# Patient Record
Sex: Female | Born: 1935 | Race: White | Hispanic: No | Smoking: Never smoker
Health system: Southern US, Community
[De-identification: ages and names within clinical notes are randomized; demographics above are authoritative.]

## PROBLEM LIST (undated history)

## (undated) DIAGNOSIS — F32A Depression, unspecified: Secondary | ICD-10-CM

## (undated) DIAGNOSIS — K219 Gastro-esophageal reflux disease without esophagitis: Secondary | ICD-10-CM

## (undated) DIAGNOSIS — H9190 Unspecified hearing loss, unspecified ear: Secondary | ICD-10-CM

## (undated) DIAGNOSIS — Z8542 Personal history of malignant neoplasm of other parts of uterus: Secondary | ICD-10-CM

## (undated) DIAGNOSIS — I10 Essential (primary) hypertension: Secondary | ICD-10-CM

## (undated) DIAGNOSIS — R0600 Dyspnea, unspecified: Secondary | ICD-10-CM

## (undated) DIAGNOSIS — M1990 Unspecified osteoarthritis, unspecified site: Secondary | ICD-10-CM

## (undated) DIAGNOSIS — R319 Hematuria, unspecified: Secondary | ICD-10-CM

## (undated) DIAGNOSIS — E785 Hyperlipidemia, unspecified: Secondary | ICD-10-CM

## (undated) DIAGNOSIS — D494 Neoplasm of unspecified behavior of bladder: Secondary | ICD-10-CM

## (undated) DIAGNOSIS — I6529 Occlusion and stenosis of unspecified carotid artery: Secondary | ICD-10-CM

## (undated) DIAGNOSIS — F329 Major depressive disorder, single episode, unspecified: Secondary | ICD-10-CM

## (undated) DIAGNOSIS — R0609 Other forms of dyspnea: Secondary | ICD-10-CM

## (undated) DIAGNOSIS — R112 Nausea with vomiting, unspecified: Secondary | ICD-10-CM

## (undated) DIAGNOSIS — Z9889 Other specified postprocedural states: Secondary | ICD-10-CM

## (undated) DIAGNOSIS — F419 Anxiety disorder, unspecified: Secondary | ICD-10-CM

## (undated) HISTORY — DX: Anxiety disorder, unspecified: F41.9

## (undated) HISTORY — PX: TRANSTHORACIC ECHOCARDIOGRAM: SHX275

## (undated) HISTORY — PX: CAROTID ENDARTERECTOMY: SUR193

## (undated) HISTORY — DX: Hyperlipidemia, unspecified: E78.5

## (undated) HISTORY — DX: Unspecified osteoarthritis, unspecified site: M19.90

## (undated) HISTORY — PX: CATARACT EXTRACTION W/ INTRAOCULAR LENS  IMPLANT, BILATERAL: SHX1307

## (undated) HISTORY — DX: Major depressive disorder, single episode, unspecified: F32.9

## (undated) HISTORY — DX: Depression, unspecified: F32.A

---

## 1999-06-11 HISTORY — PX: VAGINAL HYSTERECTOMY: SUR661

## 2009-10-17 ENCOUNTER — Encounter: Admission: RE | Admit: 2009-10-17 | Discharge: 2009-10-17 | Payer: Self-pay | Admitting: *Deleted

## 2009-10-23 ENCOUNTER — Encounter: Admission: RE | Admit: 2009-10-23 | Discharge: 2009-10-23 | Payer: Self-pay | Admitting: *Deleted

## 2010-01-09 ENCOUNTER — Ambulatory Visit: Payer: Self-pay | Admitting: Vascular Surgery

## 2010-01-19 ENCOUNTER — Ambulatory Visit: Payer: Self-pay | Admitting: Vascular Surgery

## 2010-01-19 ENCOUNTER — Inpatient Hospital Stay (HOSPITAL_COMMUNITY): Admission: RE | Admit: 2010-01-19 | Discharge: 2010-01-20 | Payer: Self-pay | Admitting: Vascular Surgery

## 2010-01-19 ENCOUNTER — Encounter: Payer: Self-pay | Admitting: Vascular Surgery

## 2010-02-06 ENCOUNTER — Ambulatory Visit: Payer: Self-pay | Admitting: Vascular Surgery

## 2010-07-01 ENCOUNTER — Encounter: Payer: Self-pay | Admitting: Internal Medicine

## 2010-08-07 ENCOUNTER — Other Ambulatory Visit (INDEPENDENT_AMBULATORY_CARE_PROVIDER_SITE_OTHER): Payer: Medicare Other

## 2010-08-07 ENCOUNTER — Ambulatory Visit (INDEPENDENT_AMBULATORY_CARE_PROVIDER_SITE_OTHER): Payer: Medicare Other | Admitting: Vascular Surgery

## 2010-08-07 DIAGNOSIS — Z48812 Encounter for surgical aftercare following surgery on the circulatory system: Secondary | ICD-10-CM

## 2010-08-07 DIAGNOSIS — I6529 Occlusion and stenosis of unspecified carotid artery: Secondary | ICD-10-CM

## 2010-08-08 NOTE — Assessment & Plan Note (Signed)
OFFICE VISIT  Ben, Loni B DOB:  1917/09/06                                       08/07/2010 CHART#:[id number]  This patient is a 77 year old female who returns for a 77-month follow-up regarding left carotid endarterectomy which I performed on 01/19/2010 for severe but asymptomatic left internal carotid stenosis.  She denies any hemispheric or non hemispheric TIAs, amaurosis fugax, diplopia, blurred vision or syncope since her surgery.  She is swallowing well and has no hoarseness.  She is taking 181 mg aspirin tablet per day.  Stable chronic medical problems. 1. Hypertension. 2. Arthritis. 3. Bilateral cataracts. 4. History of endometrial cancer in 2001. 5. Previous hysterectomy. 6. Negative coronary artery disease, diabetes or stroke.  SOCIAL HISTORY:  The patient is widowed, has 1 child and does not use tobacco or alcohol.  REVIEW OF SYSTEMS:  Denies any chest pain.  Does occasionally have dyspnea on exertion, no claudication.  She complains of occasional reflux esophagitis and dysuria.  Also arthritis, joint pain, muscle pain and decreased hearing.  All other systems are negative on complete review of systems.  PHYSICAL EXAMINATION:  Blood pressure 136/74, heart rate 79, respirations 14.  General:  She is a well-developed, well-nourished female in no apparent distress, alert and oriented x3, appears much younger than her stated age of 60.  HEENT:  Exam normal for age.  EOMs intact.  Lungs:  Clear to auscultation.  No rhonchi or wheezing. Cardiovascular:  Shows regular rhythm, no murmurs.  Carotid pulses 3+, no bruits are audible.  Left neck incision is well-healed.  Abdomen: Soft, nontender with no masses.  Neurologic:  Normal.  Today I ordered a carotid duplex exam which I reviewed and interpreted.  The left carotid endarterectomy site is widely patent and the right internal carotid is also widely patent with some minimal thickening.  I  reassured her regarding these findings.  We will see her again in 6 months for follow-up carotid duplex exam on an annual basis unless she develops any symptoms in the interim.    Quita Skye Hart [place], M.D. Electronically Signed  JDL/MEDQ  D:  08/07/2010  T:  08/08/2010  Job:  [id number]

## 2010-08-14 NOTE — Procedures (Unsigned)
CAROTID DUPLEX EXAM  INDICATION:  Carotid artery disease.  HISTORY: Diabetes:  No. Cardiac:  No. Hypertension:  Yes. Smoking:  No. Previous Surgery:  Left carotid endarterectomy with Dacron patch 06/11/2009. CV History:  The patient is currently asymptomatic. Amaurosis Fugax No, Paresthesias No, Hemiparesis No                                      RIGHT             LEFT Brachial systolic pressure:         148               146 Brachial Doppler waveforms:         WNL               WNL Vertebral direction of flow:        Antegrade         Antegrade DUPLEX VELOCITIES (cm/sec) CCA peak systolic                   76                68 ECA peak systolic                   92                91 ICA peak systolic                   81                73 ICA end diastolic                   23                18 PLAQUE MORPHOLOGY:                  Heterogeneous PLAQUE AMOUNT:                      Mild to moderate PLAQUE LOCATION:                    CCA, ICA, ECA  IMPRESSION: 1. Patent left carotid endarterectomy. 2. Bilaterally no hemodynamically significant stenosis. 3. Minimal intimal thickening within the right common carotid.  ___________________________________________ Quita Skye. Hart [place], M.D.  OD/MEDQ  D:  08/07/2010  T:  08/07/2010  Job:  [id number]

## 2010-08-24 LAB — TYPE AND SCREEN

## 2010-08-24 LAB — BASIC METABOLIC PANEL
CO2: 21 mEq/L (ref 19–32)
Chloride: 104 mEq/L (ref 96–112)
Glucose, Bld: 170 mg/dL — ABNORMAL HIGH (ref 70–99)
Potassium: 4.2 mEq/L (ref 3.5–5.1)
Sodium: 136 mEq/L (ref 135–145)

## 2010-08-24 LAB — CBC
HCT: 36.5 % (ref 36.0–46.0)
HCT: 46.2 % — ABNORMAL HIGH (ref 36.0–46.0)
Hemoglobin: 12.1 g/dL (ref 12.0–15.0)
MCH: 29.4 pg (ref 26.0–34.0)
MCH: 29.8 pg (ref 26.0–34.0)
MCHC: 33.2 g/dL (ref 30.0–36.0)
MCV: 88.6 fL (ref 78.0–100.0)
MCV: 88.7 fL (ref 78.0–100.0)
Platelets: 272 10*3/uL (ref 150–400)

## 2010-08-24 LAB — COMPREHENSIVE METABOLIC PANEL
ALT: 18 U/L (ref 0–35)
AST: 26 U/L (ref 0–37)
CO2: 23 mEq/L (ref 19–32)
Calcium: 10 mg/dL (ref 8.4–10.5)
Chloride: 106 mEq/L (ref 96–112)
GFR calc Af Amer: 48 mL/min — ABNORMAL LOW (ref >60)
GFR calc non Af Amer: 40 mL/min — ABNORMAL LOW (ref >60)
Potassium: 4.3 mEq/L (ref 3.5–5.1)
Sodium: 141 mEq/L (ref 135–145)
Total Bilirubin: 0.6 mg/dL (ref 0.3–1.2)

## 2010-08-24 LAB — URINE MICROSCOPIC-ADD ON

## 2010-08-24 LAB — URINALYSIS, ROUTINE W REFLEX MICROSCOPIC
Glucose, UA: NEGATIVE mg/dL
Ketones, ur: 15 mg/dL — AB
pH: 5.5 (ref 5.0–8.0)

## 2010-08-24 LAB — SURGICAL PCR SCREEN: Staphylococcus aureus: NEGATIVE

## 2010-08-31 ENCOUNTER — Other Ambulatory Visit: Payer: Self-pay | Admitting: Dermatology

## 2010-10-23 NOTE — Consult Note (Signed)
NEW PATIENT CONSULTATION   Karen [name], Karen [name]  DOB:  Feb 26, 1918                                       01/09/2010  CHART#:[id number]   HISTORY OF PRESENT ILLNESS:  This is a vigorous 76 year old female  patient referred by Dr. Eldridge Dace for carotid occlusive disease.  This  patient has been experiencing dizziness, particularly at night,  unaccompanied by syncope, hemiparesis, aphasia, amaurosis fugax, or  other neurologic symptoms.  She has no history of TIAs or stroke.  The  dizziness usually resolves fairly quickly and happens more frequently in  bed than during the daytime.  If she changes position rapidly, she  becomes transiently dizzy.  She was found to have a carotid bruit by Dr.  Eldridge Dace who evaluated her cardiac status and found it to be quite  adequate.  Carotid duplex revealed an 80% left internal carotid stenosis  with mild right internal carotid stenosis.  I have reviewed the study  report from [facility] regarding that test.  She also had an  echocardiogram performed at [facility] which revealed an excellent  ejection fraction and no significant abnormalities.   CHRONIC MEDICAL PROBLEMS:  1. Hypertension.  2. Arthritis.  3. Cataracts, bilateral.  4. History of endometrial cancer in 2001 status post hysterectomy.  5. Negative for coronary artery disease, diabetes, or stroke.   SOCIAL HISTORY:  The patient is widowed, has 1 child, does not use  tobacco or alcohol.   FAMILY HISTORY:  Positive for coronary artery disease in her father.  Negative for diabetes and stroke.   REVIEW OF SYSTEMS:  She does have the dizziness as noted.  Has  arthritis, joint pain, leg discomfort at night, shortness of breath with  exertion.  No orthopnea, wheezing, chronic bronchitis, or hemoptysis.  She does have some depression and anxiety.  All other systems in review  of systems are negative.   PHYSICAL EXAM:  Blood pressure 160/76, heart rate 91, respirations  14.  General:  She is a well-developed, well-nourished, elderly female in no  apparent distress, who appears younger than her stated age of 64.  She  is alert and oriented x3.  HEENT:  Normal for age.  EOMs intact.  Lungs:  Clear to auscultation.  No rhonchi or wheezing.  Cardiovascular:  Regular rhythm.  No murmurs.  Carotid pulses are 3+ with soft bruit on  the left side.  Abdomen:  Soft, nontender with no masses.  She has 3+  femoral, popliteal, and 2+ dorsalis pedis pulses bilaterally.  Musculoskeletal:  Free of major deformities.  Neurologic:  Normal.  Skin:  Free of rashes.   Today, I ordered a carotid duplex exam of the left side to compare to  the previous study and our findings are quite similar with an 80% to 90%  left internal carotid stenosis.   IMPRESSION:  I think the patient does have a 80% to 90% left internal  carotid stenosis which is asymptomatic.  I discussed the risks and  benefits of surgery versus medical management with her and her daughter-  in-law.  She would like to proceed with left carotid surgery and we have  scheduled that for Friday, August 12th, at [facility].  The risks and  benefits been thoroughly discussed and she would like to proceed.     Quita Skye Hart [place], M.D.  Electronically Signed  JDL/MEDQ  D:  01/09/2010  T:  01/09/2010  Job:  [id number]   cc:   Corky Crafts, MD  Nancee Liter, MD

## 2010-10-23 NOTE — Procedures (Signed)
CAROTID DUPLEX EXAM   INDICATION:  Follow up known left carotid artery disease.   HISTORY:  Diabetes:  No.  Cardiac:  No.  Hypertension:  Yes.  Smoking:  No.  Previous Surgery:  No.  CV History:  No.  Amaurosis Fugax No, Paresthesias No, Hemiparesis No.                                       RIGHT             LEFT  Brachial systolic pressure:         154               150  Brachial Doppler waveforms:         WNL               WNL  Vertebral direction of flow:        Antegrade         Antegrade  DUPLEX VELOCITIES (cm/sec)  CCA peak systolic                                     88  ECA peak systolic                                     147  ICA peak systolic                                     401  ICA end diastolic                                     103  PLAQUE MORPHOLOGY:                                    Heterogenous  PLAQUE AMOUNT:                                        Moderate-to-severe  PLAQUE LOCATION:                                      ICA, ECA   IMPRESSION:  1. Limited study on the left side.  2. Left internal carotid artery suggests 60% to 79% (high end of      range).  3. Antegrade flow in bilateral vertebrals.   ___________________________________________  Quita Skye Hart [place], M.D.   CB/MEDQ  D:  01/09/2010  T:  01/09/2010  Job:  [id number]

## 2010-10-23 NOTE — Assessment & Plan Note (Signed)
OFFICE VISIT   [name], Karen B  DOB:  1918-05-14                                       02/06/2010  CHART#:[id number]   The patient returns for initial followup regarding her left carotid  endarterectomy done by me on August 12 for severe left internal carotid  stenosis which is asymptomatic.  She had mild right internal carotid  occlusive disease.  She has done very well since her procedure with no  neurologic symptoms suggestive of TIAs.  She is not taking aspirin at  the present time.  She is swallowing well and has no hoarseness.   PHYSICAL EXAMINATION:  Blood pressure 148/86, heart rate 99,  respirations 14.  Left neck incision is healed nicely.  She has 3+  carotid pulse with no audible bruit.  Her neurologic exam is normal.   I have suggested that she do take one 81 mg aspirin tablet a day if she  can tolerate this.  She will return to her normal activities and return  to see me in 6 months with a followup carotid duplex exam at that time  unless she develops any neurologic symptoms in the interim.     Quita Skye Hart [place], M.D.  Electronically Signed   JDL/MEDQ  D:  02/06/2010  T:  02/07/2010  Job:  [id number]   cc:   Corky Crafts, MD

## 2011-02-05 ENCOUNTER — Other Ambulatory Visit: Payer: Medicare Other

## 2011-02-26 ENCOUNTER — Other Ambulatory Visit (INDEPENDENT_AMBULATORY_CARE_PROVIDER_SITE_OTHER): Payer: Medicare Other | Admitting: *Deleted

## 2011-02-26 DIAGNOSIS — Z48812 Encounter for surgical aftercare following surgery on the circulatory system: Secondary | ICD-10-CM

## 2011-02-26 DIAGNOSIS — I6529 Occlusion and stenosis of unspecified carotid artery: Secondary | ICD-10-CM

## 2011-03-06 ENCOUNTER — Encounter: Payer: Self-pay | Admitting: Vascular Surgery

## 2011-03-06 NOTE — Procedures (Unsigned)
CAROTID DUPLEX EXAM  INDICATION:  Carotid artery disease.  HISTORY: Diabetes:  No. Cardiac:  No. Hypertension:  Yes. Smoking:  No. Previous Surgery:  Left carotid endarterectomy with Dacron patch angioplasty 06/11/2009. CV History:  Currently asymptomatic. Amaurosis Fugax No, Paresthesias No, Hemiparesis No                                      RIGHT             LEFT Brachial systolic pressure:         144               142 Brachial Doppler waveforms:         Normal            Normal Vertebral direction of flow:        Antegrade         Antegrade DUPLEX VELOCITIES (cm/sec) CCA peak systolic                   71                58 ECA peak systolic                   87                151 ICA peak systolic                   91                84 ICA end diastolic                   27                25 PLAQUE MORPHOLOGY:                  Calcific PLAQUE AMOUNT:                      Minimal           None PLAQUE LOCATION:                    ICA, bifurcation  IMPRESSION: 1. Right internal carotid artery velocities suggest 1%-39% stenosis. 2. Patent left carotid endarterectomy site with no evidence of     restenosis of the internal carotid artery. 3. Stable in comparison to the previous exam.  ___________________________________________ Quita Skye. Hart Rochester, M.D.  EM/MEDQ  D:  02/26/2011  T:  02/26/2011  Job:  [id number]

## 2011-03-15 ENCOUNTER — Other Ambulatory Visit: Payer: Self-pay | Admitting: Vascular Surgery

## 2011-03-15 DIAGNOSIS — Z48812 Encounter for surgical aftercare following surgery on the circulatory system: Secondary | ICD-10-CM

## 2011-03-15 DIAGNOSIS — I6529 Occlusion and stenosis of unspecified carotid artery: Secondary | ICD-10-CM

## 2011-06-21 DIAGNOSIS — M1710 Unilateral primary osteoarthritis, unspecified knee: Secondary | ICD-10-CM | POA: Diagnosis not present

## 2011-06-21 DIAGNOSIS — IMO0002 Reserved for concepts with insufficient information to code with codable children: Secondary | ICD-10-CM | POA: Diagnosis not present

## 2011-06-21 DIAGNOSIS — E782 Mixed hyperlipidemia: Secondary | ICD-10-CM | POA: Diagnosis not present

## 2011-06-21 DIAGNOSIS — J329 Chronic sinusitis, unspecified: Secondary | ICD-10-CM | POA: Diagnosis not present

## 2011-06-21 DIAGNOSIS — Z780 Asymptomatic menopausal state: Secondary | ICD-10-CM | POA: Diagnosis not present

## 2011-06-21 DIAGNOSIS — I10 Essential (primary) hypertension: Secondary | ICD-10-CM | POA: Diagnosis not present

## 2011-07-25 DIAGNOSIS — E782 Mixed hyperlipidemia: Secondary | ICD-10-CM | POA: Diagnosis not present

## 2011-07-25 DIAGNOSIS — M1710 Unilateral primary osteoarthritis, unspecified knee: Secondary | ICD-10-CM | POA: Diagnosis not present

## 2011-07-25 DIAGNOSIS — IMO0002 Reserved for concepts with insufficient information to code with codable children: Secondary | ICD-10-CM | POA: Diagnosis not present

## 2011-07-25 DIAGNOSIS — T783XXA Angioneurotic edema, initial encounter: Secondary | ICD-10-CM | POA: Diagnosis not present

## 2011-08-20 DIAGNOSIS — R0602 Shortness of breath: Secondary | ICD-10-CM | POA: Diagnosis not present

## 2011-08-20 DIAGNOSIS — I6529 Occlusion and stenosis of unspecified carotid artery: Secondary | ICD-10-CM | POA: Diagnosis not present

## 2011-08-20 DIAGNOSIS — I10 Essential (primary) hypertension: Secondary | ICD-10-CM | POA: Diagnosis not present

## 2011-11-26 DIAGNOSIS — Z85828 Personal history of other malignant neoplasm of skin: Secondary | ICD-10-CM | POA: Diagnosis not present

## 2011-11-26 DIAGNOSIS — L821 Other seborrheic keratosis: Secondary | ICD-10-CM | POA: Diagnosis not present

## 2011-12-20 DIAGNOSIS — M19049 Primary osteoarthritis, unspecified hand: Secondary | ICD-10-CM | POA: Diagnosis not present

## 2011-12-20 DIAGNOSIS — I10 Essential (primary) hypertension: Secondary | ICD-10-CM | POA: Diagnosis not present

## 2011-12-20 DIAGNOSIS — R319 Hematuria, unspecified: Secondary | ICD-10-CM | POA: Diagnosis not present

## 2012-01-16 DIAGNOSIS — R310 Gross hematuria: Secondary | ICD-10-CM | POA: Diagnosis not present

## 2012-01-24 DIAGNOSIS — R310 Gross hematuria: Secondary | ICD-10-CM | POA: Diagnosis not present

## 2012-01-24 DIAGNOSIS — K5730 Diverticulosis of large intestine without perforation or abscess without bleeding: Secondary | ICD-10-CM | POA: Diagnosis not present

## 2012-01-29 DIAGNOSIS — N281 Cyst of kidney, acquired: Secondary | ICD-10-CM | POA: Diagnosis not present

## 2012-01-29 DIAGNOSIS — R310 Gross hematuria: Secondary | ICD-10-CM | POA: Diagnosis not present

## 2012-01-29 DIAGNOSIS — C679 Malignant neoplasm of bladder, unspecified: Secondary | ICD-10-CM | POA: Diagnosis not present

## 2012-01-30 ENCOUNTER — Other Ambulatory Visit: Payer: Self-pay | Admitting: Urology

## 2012-02-13 ENCOUNTER — Encounter: Payer: Self-pay | Admitting: Neurosurgery

## 2012-02-17 ENCOUNTER — Encounter: Payer: Self-pay | Admitting: Neurosurgery

## 2012-02-18 ENCOUNTER — Encounter: Payer: Self-pay | Admitting: Neurosurgery

## 2012-02-18 ENCOUNTER — Ambulatory Visit (INDEPENDENT_AMBULATORY_CARE_PROVIDER_SITE_OTHER): Payer: Medicare Other | Admitting: Neurosurgery

## 2012-02-18 ENCOUNTER — Other Ambulatory Visit (INDEPENDENT_AMBULATORY_CARE_PROVIDER_SITE_OTHER): Payer: Medicare Other | Admitting: *Deleted

## 2012-02-18 VITALS — BP 139/75 | HR 94 | Resp 18 | Ht 65.0 in | Wt 145.2 lb

## 2012-02-18 DIAGNOSIS — Z48812 Encounter for surgical aftercare following surgery on the circulatory system: Secondary | ICD-10-CM

## 2012-02-18 DIAGNOSIS — I6529 Occlusion and stenosis of unspecified carotid artery: Secondary | ICD-10-CM

## 2012-02-18 NOTE — Progress Notes (Signed)
[facility] [place] Carotid Office Note  CC: Annual carotid surveillance Referring Physician: Hart Rochester  History of Present Illness: 78 year old female patient of Dr. Hart Rochester followed status post left CEA in 2011. The patient denies any signs or symptoms of CVA, TIA, amaurosis fugax or any neural deficit. The patient denies any new medical diagnoses or recent surgery.  Past Medical History  Diagnosis Date  . Hyperlipidemia   . Change in hearing   . Reflux   . Depression   . Anxiety   . Carotid artery occlusion   . Dyspnea   . Bruit     Carotid  . Arthritis     Osteo-  . Cancer 2001    Endometrial   . CA - breast cancer     Breast   . Cancer     Bladder    ROS: [x]  Positive   [ ]  Denies    General: [ ]  Weight loss, [ ]  Fever, [ ]  chills Neurologic: [ ]  Dizziness, [ ]  Blackouts, [ ]  Seizure [ ]  Stroke, [ ]  "Mini stroke", [ ]  Slurred speech, [ ]  Temporary blindness; [ ]  weakness in arms or legs, [ ]  Hoarseness Cardiac: [ ]  Chest pain/pressure, [ ]  Shortness of breath at rest [x ] Shortness of breath with exertion, [ ]  Atrial fibrillation or irregular heartbeat Vascular: [ ]  Pain in legs with walking, [ ]  Pain in legs at rest, [ ]  Pain in legs at night,  [ ]  Non-healing ulcer, [ ]  Blood clot in vein/DVT,   Pulmonary: [ ]  Home oxygen, [ ]  Productive cough, [ ]  Coughing up blood, [ ]  Asthma,  [ ]  Wheezing Musculoskeletal:  [ ]  Arthritis, [ ]  Low back pain, [ ]  Joint pain Hematologic: [ ]  Easy Bruising, [ ]  Anemia; [ ]  Hepatitis Gastrointestinal: [ ]  Blood in stool, [ ]  Gastroesophageal Reflux/heartburn, [ ]  Trouble swallowing Urinary: [ ]  chronic Kidney disease, [ ]  on HD - [ ]  MWF or [ ]  TTHS, [ ]  Burning with urination, [ ]  Difficulty urinating Skin: [ ]  Rashes, [ ]  Wounds Psychological: [ ]  Anxiety, [ ]  Depression   Social History History  Substance Use Topics  . Smoking status: Never Smoker   . Smokeless tobacco: Not on file  . Alcohol Use: No     Family History Family History  Problem Relation Age of Onset  . Coronary artery disease Father   . Hypertension Father   . Hypertension Mother   . Cancer Mother     Colon    Allergies  Allergen Reactions  . Lisinopril Swelling  . Celebrex (Celecoxib) Hives and Itching    Current Outpatient Prescriptions  Medication Sig Dispense Refill  . ALPRAZolam (XANAX) 0.25 MG tablet Take 0.25 mg by mouth at bedtime as needed.      Marland Kitchen AMLODIPINE BESYLATE PO Take 10 mg by mouth daily.      Marland Kitchen aspirin 325 MG tablet Take 325 mg by mouth daily.      . fish oil-omega-3 fatty acids 1000 MG capsule Take 1,000 mg by mouth daily.      Marland Kitchen lisinopril (PRINIVIL,ZESTRIL) 5 MG tablet Take 5 mg by mouth daily.      . meclizine (ANTIVERT) 12.5 MG tablet Take 12.5 mg by mouth as needed.      . Multiple Vitamin (MULTIVITAMIN) tablet Take 1 tablet by mouth daily.      . pravastatin (PRAVACHOL) 40 MG tablet Take 40 mg by mouth daily.      Marland Kitchen  ranitidine (ZANTAC) 150 MG capsule Take 150 mg by mouth 2 (two) times daily.        Physical Examination  Filed Vitals:   02/18/12 1544  BP: 139/75  Pulse: 94  Resp:     Body mass index is 24.16 kg/(m^2).  General:  WDWN in NAD Gait: Normal HEENT: WNL Eyes: Pupils equal Pulmonary: normal non-labored breathing , without Rales, rhonchi,  wheezing Cardiac: RRR, without  Murmurs, rubs or gallops; Abdomen: soft, NT, no masses Skin: no rashes, ulcers noted  Vascular Exam Pulses: 2+ radial pulses bilaterally Carotid bruits: Carotid pulses to auscultation no bruits are heard Extremities without ischemic changes, no Gangrene , no cellulitis; no open wounds;  Musculoskeletal: no muscle wasting or atrophy   Neurologic: A&O X 3; Appropriate Affect ; SENSATION: normal; MOTOR FUNCTION:  moving all extremities equally. Speech is fluent/normal  Non-Invasive Vascular Imaging CAROTID DUPLEX 02/18/2012  Right ICA 20 - 39 % stenosis Left ICA 0 - 19%  stenosis   ASSESSMENT/PLAN: Asymptomatic patient with no change in her duplex from one year ago. The patient will followup here in one year with repeat carotid duplex. The patient's questions were encouraged and answered, she is in agreement with this plan.  Lauree Chandler ANP   Clinic MD: Hart Rochester

## 2012-02-18 NOTE — Addendum Note (Signed)
Addended by: Sharee Pimple on: 02/18/2012 04:19 PM   Modules accepted: Orders

## 2012-03-11 ENCOUNTER — Encounter (HOSPITAL_BASED_OUTPATIENT_CLINIC_OR_DEPARTMENT_OTHER): Payer: Self-pay | Admitting: *Deleted

## 2012-03-11 NOTE — Progress Notes (Signed)
SPOKE TO DAUGHTER-N-LAW, JO. PT HOH EVEN W/ HEARING AIDS. NPO AFTER MN. ARRIVES AT 1015. NEEDS ISTAT. LAST OFFICE NOTE AND EKG TO BE FAXED FROM DR Eldridge Dace. WILL TAKE NORVASC AM OF SURG W/ SIP OF WATER.

## 2012-03-15 NOTE — H&P (Signed)
History of Present Illness         Gross hematuria: The patient reported having had gross hematuria the morning of her routine six-month visit with Dr. Clent Ridges on 12/20/11. She has no prior history of gross hematuria.she indicated that this happened one time only. She said she was having some cramping in her legs and drank some tonic water with quinine and attributed the blood in her urine do that. She's not had any further quinine. She has nocturia 0-1x but otherwise no voiding symptoms. She denies any dysuria. She's never had a UTI or kidney stone.    Interval history. No new urologic complaints are noted today.   Past Medical History Problems  1. History of  Anxiety (Symptom) 300.00 2. History of  Arthritis V13.4 3. History of  Esophageal Reflux 530.81 4. History of  Hypertension 401.9 5. History of  Skin Cancer V10.83  Surgical History Problems  1. History of  Cataract Surgery Bilateral 2. History of  Hysteroscopy Of Uterus 3. History of  Neck Surgery  Current Meds 1. ALPRAZolam 0.25 MG Oral Tablet; Therapy: (Recorded:08Aug2013) to 2. AmLODIPine Besylate 10 MG Oral Tablet; Therapy: (Recorded:08Aug2013) to 3. Aspirin 81 MG Oral Tablet; Therapy: (Recorded:08Aug2013) to 4. Centrum Silver TABS; Therapy: (Recorded:08Aug2013) to 5. Fish Oil 1000 MG Oral Capsule; Therapy: (Recorded:08Aug2013) to 6. Meclizine HCl 25 MG Oral Tablet; Therapy: (Recorded:08Aug2013) to 7. Pravachol 40 MG Oral Tablet; Therapy: (Recorded:08Aug2013) to 8. Tums CHEW; Therapy: (Recorded:08Aug2013) to 9. Tylenol 325 MG Oral Tablet; Therapy: (Recorded:08Aug2013) to  Allergies Medication  1. CeleBREX CAPS 2. Lisinopril TABS  Family History Problems  1. Family history of  Acute Myocardial Infarction V17.3 2. Family history of  Cancer  Social History Problems  1. Caffeine Use 2. Marital History - Widowed 3. Never A Smoker Denied  4. History of  Alcohol Use  Review of Systems Genitourinary, constitutional,  skin, eye, otolaryngeal, hematologic/lymphatic, cardiovascular, pulmonary, endocrine, musculoskeletal, gastrointestinal, neurological and psychiatric system(s) were reviewed and pertinent findings if present are noted.  Genitourinary: hematuria.  Gastrointestinal: heartburn and constipation.  Constitutional: feeling tired (fatigue).  Integumentary: skin rash/lesion.  ENT: sinus problems.  Respiratory: shortness of breath.  Musculoskeletal: back pain and joint pain.  Neurological: dizziness and headache.  Psychiatric: depression and anxiety.    Vitals Vital Signs  BMI Calculated: 24.16 BSA Calculated: 1.72 Height: 5 ft 5 in Weight: 145 lb  Blood Pressure: 158 / 69 Temperature: 97 F Heart Rate: 88  Physical Exam Constitutional: Well nourished and well developed . No acute distress.  ENT:. The ears and nose are normal in appearance.  Neck: The appearance of the neck is normal and no neck mass is present.  Pulmonary: No respiratory distress and normal respiratory rhythm and effort.  Cardiovascular: Heart rate and rhythm are normal . No peripheral edema.  Abdomen: The abdomen is soft and nontender. No masses are palpated. No CVA tenderness. No hernias are palpable. No hepatosplenomegaly noted.  Lymphatics: The femoral and inguinal nodes are not enlarged or tender.  Skin: Normal skin turgor, no visible rash and no visible skin lesions.  Neuro/Psych:. Mood and affect are appropriate.  AU CT-HEMATURIA PROTOCOL 16Aug2013 12:00AM Ihor Gully  Test Name Result Flag Reference ** RADIOLOGY REPORT BY [facility] ** ORIGINAL APPROVED BY: Juline Patch, M.D. ON: 01/24/2012 11:57:15   *RADIOLOGY REPORT*  Clinical Data: Gross hematuria 1 month ago, history of uterine carcinoma, no history of kidney stones  CT ABDOMEN AND PELVIS WITHOUT AND WITH CONTRAST  Technique: Multidetector  CT imaging of the abdomen and pelvis was performed without contrast material in one or both body  regions, followed by contrast material(s) and further sections in one or both body regions.  Contrast: 125 ml Isovue 300  Comparison: None.  Findings: The lung bases are clear. On the unenhanced portion of the study there is cardiomegaly present. A calcified hepatic granuloma is noted near the dome. The liver is unremarkable in the unenhanced state. No calcified gallstones are seen and no renal calculi are noted.  After contrast administration, the liver enhances with no focal abnormality. No ductal dilatation is seen. No calcified gallstones are noted. The pancreas is normal in size and the pancreatic duct is not distended. The adrenal glands and spleen are unremarkable. The stomach is decompressed and cannot be well evaluated. The kidneys enhance with no calculus or mass and no hydronephrosis is seen. Left renal parapelvic cysts are noted. The proximal ureters are normal in caliber.  The ureters are normal in caliber to the urinary bladder with no calculus or obstruction. The urinary bladder is moderately well opacified with contrast and no intraluminal bladder lesion is seen. The abdominal aorta is normal in caliber with mild to moderate atheromatous change present. No adenopathy is seen. Multiple rectosigmoid colonic diverticula are noted. In addition, there are diverticula scattered throughout the remainder of the colon. No fluid is seen within the pelvis. The terminal ileum is unremarkable. There are degenerative changes throughout the lumbar spine.  IMPRESSION:  1. No explanation for the patient's prior hematuria is seen. 2. No renal calculi. Parapelvic cysts are noted on the left. 3. Multiple colonic diverticula primarily in the rectosigmoid colon.  BUN & CREATININE 08Aug2013 11:35AM Ihor Gully SPECIMEN TYPE: BLOOD  Test Name Result Flag Reference CREATININE 1.25 mg/dL  1.47-8.29 BUN 18 mg/dL  5-62 Est GFR, African American 43 mL/min L  Est GFR, NonAfrican  American 37 mL/min L  The estimated GFR is a calculation valid for adults (74 to 76 years old) that uses the CKD-EPI algorithm to adjust for age and sex. It is not to be used for children, pregnant women, hospitalized patients, patients on dialysis, or with rapidly changing kidney function. According to the NKDEP, eGFR >89 is normal, 60-89 shows mild impairment, 30-59 shows moderate impairment, 15-29 shows severe impairment and <15 is ESRD.  Procedure  Procedure: Cystoscopy  Chaperone Present: .  Indication: Hematuria.  Informed Consent: Risks, benefits, and potential adverse events were discussed and informed consent was obtained from the patient.  Prep: The patient was prepped with hibiclens.  Procedure Note:  Urethral meatus:. No abnormalities.  Anterior urethra: No abnormalities.  Bladder: Visulization was clear. The ureteral orifices were in the normal anatomic position bilaterally and had clear efflux of urine. A systematic survey of the bladder demonstrated no bladder tumors or stones. A solitary tumor was visualized in the bladder. A papillary tumor was seen in the bladder. This tumor was located on the left side, on the posterior aspect of the bladder. The patient tolerated the procedure well.  Complications: None.    Assessment Assessed  1. Gross Hematuria 599.71 2. 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 She was found cystoscopically to have what appears to be a transitional cell carcinoma of the bladder. It  is papillary in configuration and look at it is probably going to be low-grade and superficial. It is located on the left posterior wall. I saw no other worrisome lesions within the bladder. We discussed today the need to remove this growth for pathologic  evaluation and also to prevent further difficulties such as gross hematuria. I've gone over the procedure with her in detail including its risks and complications. She understands and has elected to proceed.   Plan     She is scheduled for an outpatient TURBT.

## 2012-03-16 ENCOUNTER — Ambulatory Visit (HOSPITAL_BASED_OUTPATIENT_CLINIC_OR_DEPARTMENT_OTHER)
Admission: RE | Admit: 2012-03-16 | Discharge: 2012-03-16 | Disposition: A | Discharge status: Home / Self Care | Payer: Medicare Other | Source: Ambulatory Visit | Attending: Urology | Admitting: Urology

## 2012-03-16 ENCOUNTER — Ambulatory Visit (HOSPITAL_BASED_OUTPATIENT_CLINIC_OR_DEPARTMENT_OTHER): Payer: Medicare Other | Admitting: Anesthesiology

## 2012-03-16 ENCOUNTER — Encounter (HOSPITAL_BASED_OUTPATIENT_CLINIC_OR_DEPARTMENT_OTHER): Payer: Self-pay | Admitting: Anesthesiology

## 2012-03-16 ENCOUNTER — Encounter (HOSPITAL_BASED_OUTPATIENT_CLINIC_OR_DEPARTMENT_OTHER)
Admission: RE | Disposition: A | Discharge status: Home / Self Care | Payer: Self-pay | Source: Ambulatory Visit | Attending: Urology

## 2012-03-16 ENCOUNTER — Encounter (HOSPITAL_BASED_OUTPATIENT_CLINIC_OR_DEPARTMENT_OTHER): Payer: Self-pay

## 2012-03-16 DIAGNOSIS — K219 Gastro-esophageal reflux disease without esophagitis: Secondary | ICD-10-CM | POA: Insufficient documentation

## 2012-03-16 DIAGNOSIS — Z7982 Long term (current) use of aspirin: Secondary | ICD-10-CM | POA: Insufficient documentation

## 2012-03-16 DIAGNOSIS — D494 Neoplasm of unspecified behavior of bladder: Secondary | ICD-10-CM

## 2012-03-16 DIAGNOSIS — C670 Malignant neoplasm of trigone of bladder: Secondary | ICD-10-CM | POA: Diagnosis not present

## 2012-03-16 DIAGNOSIS — Z79899 Other long term (current) drug therapy: Secondary | ICD-10-CM | POA: Diagnosis not present

## 2012-03-16 DIAGNOSIS — Z85828 Personal history of other malignant neoplasm of skin: Secondary | ICD-10-CM | POA: Insufficient documentation

## 2012-03-16 DIAGNOSIS — I10 Essential (primary) hypertension: Secondary | ICD-10-CM | POA: Insufficient documentation

## 2012-03-16 DIAGNOSIS — C679 Malignant neoplasm of bladder, unspecified: Secondary | ICD-10-CM | POA: Diagnosis not present

## 2012-03-16 HISTORY — DX: Other forms of dyspnea: R06.09

## 2012-03-16 HISTORY — DX: Gastro-esophageal reflux disease without esophagitis: K21.9

## 2012-03-16 HISTORY — PX: TRANSURETHRAL RESECTION OF BLADDER TUMOR: SHX2575

## 2012-03-16 HISTORY — DX: Nausea with vomiting, unspecified: R11.2

## 2012-03-16 HISTORY — DX: Hematuria, unspecified: R31.9

## 2012-03-16 HISTORY — DX: Unspecified hearing loss, unspecified ear: H91.90

## 2012-03-16 HISTORY — DX: Other specified postprocedural states: Z98.890

## 2012-03-16 HISTORY — DX: Essential (primary) hypertension: I10

## 2012-03-16 HISTORY — DX: Personal history of malignant neoplasm of other parts of uterus: Z85.42

## 2012-03-16 HISTORY — DX: Neoplasm of unspecified behavior of bladder: D49.4

## 2012-03-16 HISTORY — DX: Dyspnea, unspecified: R06.00

## 2012-03-16 HISTORY — DX: Occlusion and stenosis of unspecified carotid artery: I65.29

## 2012-03-16 LAB — POCT I-STAT, CHEM 8
BUN: 20 mg/dL (ref 6–23)
Calcium, Ion: 1.26 mmol/L (ref 1.13–1.30)
Chloride: 109 mEq/L (ref 96–112)
Creatinine, Ser: 1.1 mg/dL (ref 0.50–1.10)
Glucose, Bld: 101 mg/dL — ABNORMAL HIGH (ref 70–99)
HCT: 42 % (ref 36.0–46.0)
Hemoglobin: 14.3 g/dL (ref 12.0–15.0)
Potassium: 4.3 mEq/L (ref 3.5–5.1)
Sodium: 142 mEq/L (ref 135–145)
TCO2: 22 mmol/L (ref 0–100)

## 2012-03-16 SURGERY — TURBT (TRANSURETHRAL RESECTION OF BLADDER TUMOR)
Anesthesia: General | Site: Bladder | Wound class: Clean Contaminated

## 2012-03-16 MED ORDER — HYDROCODONE-ACETAMINOPHEN 5-325 MG PO TABS: 1.0000 | ORAL_TABLET | Freq: Four times a day (QID) | ORAL | Status: DC | PRN

## 2012-03-16 MED ORDER — FENTANYL CITRATE 0.05 MG/ML IJ SOLN: 25.0000 ug | INTRAMUSCULAR | Status: DC | PRN

## 2012-03-16 MED ORDER — DEXAMETHASONE SODIUM PHOSPHATE 4 MG/ML IJ SOLN
INTRAMUSCULAR | Status: DC | PRN
  Administered 2012-03-16: 4 mg via INTRAVENOUS

## 2012-03-16 MED ORDER — PHENAZOPYRIDINE HCL 200 MG PO TABS: 200.0000 mg | ORAL_TABLET | Freq: Three times a day (TID) | ORAL | Status: DC | PRN

## 2012-03-16 MED ORDER — METOCLOPRAMIDE HCL 5 MG/ML IJ SOLN
INTRAMUSCULAR | Status: DC | PRN
  Administered 2012-03-16: 5 mg via INTRAVENOUS

## 2012-03-16 MED ORDER — PROPOFOL 10 MG/ML IV BOLUS
INTRAVENOUS | Status: DC | PRN
  Administered 2012-03-16: 120 mg via INTRAVENOUS

## 2012-03-16 MED ORDER — CIPROFLOXACIN IN D5W 200 MG/100ML IV SOLN
200.0000 mg | INTRAVENOUS | Status: AC
  Administered 2012-03-16: 200 mg via INTRAVENOUS

## 2012-03-16 MED ORDER — ONDANSETRON HCL 4 MG/2ML IJ SOLN
INTRAMUSCULAR | Status: DC | PRN
  Administered 2012-03-16: 4 mg via INTRAVENOUS

## 2012-03-16 MED ORDER — LIDOCAINE HCL (CARDIAC) 20 MG/ML IV SOLN
INTRAVENOUS | Status: DC | PRN
  Administered 2012-03-16: 60 mg via INTRAVENOUS

## 2012-03-16 MED ORDER — EPHEDRINE SULFATE 50 MG/ML IJ SOLN
INTRAMUSCULAR | Status: DC | PRN
  Administered 2012-03-16: 10 mg via INTRAVENOUS

## 2012-03-16 MED ORDER — PROMETHAZINE HCL 25 MG/ML IJ SOLN: 6.2500 mg | INTRAMUSCULAR | Status: DC | PRN

## 2012-03-16 MED ORDER — PHENAZOPYRIDINE HCL 200 MG PO TABS: 200.0000 mg | ORAL_TABLET | Freq: Once | ORAL | Status: DC

## 2012-03-16 MED ORDER — FENTANYL CITRATE 0.05 MG/ML IJ SOLN
INTRAMUSCULAR | Status: DC | PRN
  Administered 2012-03-16: 25 ug via INTRAVENOUS
  Administered 2012-03-16: 50 ug via INTRAVENOUS

## 2012-03-16 MED ORDER — LACTATED RINGERS IV SOLN
INTRAVENOUS | Status: DC
  Administered 2012-03-16: 10:00:00 via INTRAVENOUS

## 2012-03-16 MED ORDER — SODIUM CHLORIDE 0.9 % IR SOLN
Status: DC | PRN
  Administered 2012-03-16: 6000 mL

## 2012-03-16 MED ORDER — LACTATED RINGERS IV SOLN: INTRAVENOUS | Status: DC

## 2012-03-16 SURGICAL SUPPLY — 29 items
BAG DRAIN URO-CYSTO SKYTR STRL (DRAIN) ×2 IMPLANT
BAG URINE DRAINAGE (UROLOGICAL SUPPLIES) ×2 IMPLANT
BAG URINE LEG 19OZ MD ST LTX (BAG) IMPLANT
CANISTER SUCT LVC 12 LTR MEDI- (MISCELLANEOUS) ×4 IMPLANT
CATH FOLEY 2WAY SLVR  5CC 20FR (CATHETERS)
CATH FOLEY 2WAY SLVR  5CC 22FR (CATHETERS)
CATH FOLEY 2WAY SLVR  5CC 24FR (CATHETERS) ×1
CATH FOLEY 2WAY SLVR 5CC 20FR (CATHETERS) IMPLANT
CATH FOLEY 2WAY SLVR 5CC 22FR (CATHETERS) IMPLANT
CATH FOLEY 2WAY SLVR 5CC 24FR (CATHETERS) ×1 IMPLANT
CLOTH BEACON ORANGE TIMEOUT ST (SAFETY) ×2 IMPLANT
DRAPE CAMERA CLOSED 9X96 (DRAPES) ×2 IMPLANT
ELECT BUTTON HF 24-28F 2 30DE (ELECTRODE) IMPLANT
ELECT LOOP MED HF 24F 12D (CUTTING LOOP) IMPLANT
ELECT REM PT RETURN 9FT ADLT (ELECTROSURGICAL) ×2
ELECT RESECT VAPORIZE 12D CBL (ELECTRODE) ×2 IMPLANT
ELECTRODE REM PT RTRN 9FT ADLT (ELECTROSURGICAL) ×1 IMPLANT
EVACUATOR MICROVAS BLADDER (UROLOGICAL SUPPLIES) ×2 IMPLANT
GLOVE BIO SURGEON STRL SZ8 (GLOVE) ×2 IMPLANT
GOWN PREVENTION PLUS LG XLONG (DISPOSABLE) ×2 IMPLANT
GOWN PREVENTION PLUS XXLARGE (GOWN DISPOSABLE) ×4 IMPLANT
GOWN STRL REIN XL XLG (GOWN DISPOSABLE) ×2 IMPLANT
HOLDER FOLEY CATH W/STRAP (MISCELLANEOUS) ×2 IMPLANT
IV NS IRRIG 3000ML ARTHROMATIC (IV SOLUTION) ×4 IMPLANT
KIT ASPIRATION TUBING (SET/KITS/TRAYS/PACK) ×2 IMPLANT
PACK CYSTOSCOPY (CUSTOM PROCEDURE TRAY) ×2 IMPLANT
PLUG CATH AND CAP STER (CATHETERS) ×2 IMPLANT
SET ASPIRATION TUBING (TUBING) IMPLANT
WATER STERILE IRR 3000ML UROMA (IV SOLUTION) IMPLANT

## 2012-03-16 NOTE — Op Note (Signed)
PATIENT:  Karen [name]  PRE-OPERATIVE DIAGNOSIS: Bladder tumor  POST-OPERATIVE DIAGNOSIS: Same  PROCEDURE:  Procedure(s): TRANSURETHRAL RESECTION OF BLADDER TUMOR (TURBT) (0.5cm.)  SURGEON:  Surgeon(s): Garnett Farm  ANESTHESIA:   General  EBL:  Minimal  DRAINS: None  SPECIMEN:  Source of Specimen:  Bladder tumor  DISPOSITION OF SPECIMEN:  PATHOLOGY  Indication: [name] is a 78 year old female who had evaluation for hematuria with a CT scan which revealed no abnormality of the upper back. Cystoscopically I found a papillary tumor in the bladder on the left floor of the bladder. She is brought to the operating room today for biopsy.  Description of operation: The patient was taken to the operating room and administered general anesthesia. She was then placed on the table and moved to the dorsal lithotomy position after which her genitalia was sterilely prepped and draped. An official timeout was then performed.  The 22 French cystoscope with a 12 lens was then introduced into the bladder. The bladder was fully and systematically inspected. Ureteral orifices were noted to be in the normal anatomic positions. A single papillary tumor was identified just posterior and lateral to the left ureteral orifice. It was well away from the orifice however.  I passed the cold cup biopsy forceps through the cystoscope and was able to essentially remove the majority of the tumor with one cold cup biopsy. There was some slightly abnormal appearing mucosa near the biopsy site which was removed with the cold cup biopsy forceps. After completing this no further abnormal bladder mucosa could be identified. I then inserted the Bugbee electrode and began fulgurating but found that this was not working properly so I removed the cystoscope and replaced it with the resectoscope sheath and then inserted the resectoscope element and used the loop to fulgurate the floor of the biopsy site as well as the  surrounding mucosa. This was photographed and noted to be well away from the ureteral orifice. No perforation was noted. I therefore drained the bladder, removed the resectoscope and the patient was awakened and taken to the recovery room in stable and satisfactory condition. She tolerated it well with no intraoperative complications. The lesion appeared papillary, superficial and likely low-grade and therefore I elected not to instill mitomycin-C.   PLAN OF CARE: Discharge to home after PACU  PATIENT DISPOSITION:  PACU - hemodynamically stable.

## 2012-03-16 NOTE — Interval H&P Note (Signed)
History and Physical Interval Note:  03/16/2012 10:56 AM  Karen [name]  has presented today for surgery, with the diagnosis of Bladder Tumor  The various methods of treatment have been discussed with the patient and family. After consideration of risks, benefits and other options for treatment, the patient has consented to  Procedure(s) (LRB) with comments: TRANSURETHRAL RESECTION OF BLADDER TUMOR (TURBT) (N/A) - 1 hour requested for this case  CAMERA GYRUS as a surgical intervention .  The patient's history has been reviewed, patient examined, no change in status, stable for surgery.  I have reviewed the patient's chart and labs.  Questions were answered to the patient's satisfaction.     Garnett Farm

## 2012-03-16 NOTE — Anesthesia Preprocedure Evaluation (Addendum)
Anesthesia Evaluation  Patient identified by MRN, date of birth, ID band Patient awake    Reviewed: Allergy & Precautions, H&P , NPO status , Patient's Chart, lab work & pertinent test results  History of Anesthesia Complications (+) PONV  Airway Mallampati: II TM Distance: >3 FB Neck ROM: Full    Dental  (+) Partial Upper, Edentulous Lower, Lower Dentures and Dental Advisory Given   Pulmonary shortness of breath,  breath sounds clear to auscultation  Pulmonary exam normal       Cardiovascular hypertension, Pt. on medications + Peripheral Vascular Disease Rhythm:Regular Rate:Normal     Neuro/Psych Anxiety Depression negative neurological ROS     GI/Hepatic Neg liver ROS, GERD-  ,  Endo/Other  negative endocrine ROS  Renal/GU negative Renal ROS  negative genitourinary   Musculoskeletal negative musculoskeletal ROS (+)   Abdominal   Peds  Hematology negative hematology ROS (+)   Anesthesia Other Findings   Reproductive/Obstetrics negative OB ROS                          Anesthesia Physical Anesthesia Plan  ASA: II  Anesthesia Plan: General   Post-op Pain Management:    Induction: Intravenous  Airway Management Planned: LMA  Additional Equipment:   Intra-op Plan:   Post-operative Plan: Extubation in OR  Informed Consent: I have reviewed the patients History and Physical, chart, labs and discussed the procedure including the risks, benefits and alternatives for the proposed anesthesia with the patient or authorized representative who has indicated his/her understanding and acceptance.   Dental advisory given  Plan Discussed with: CRNA  Anesthesia Plan Comments:         Anesthesia Quick Evaluation

## 2012-03-16 NOTE — Transfer of Care (Signed)
Immediate Anesthesia Transfer of Care Note  Patient: Karen [name]  Procedure(s) Performed: Procedure(s) (LRB): TRANSURETHRAL RESECTION OF BLADDER TUMOR (TURBT) (N/A)  Patient Location: PACU  Anesthesia Type: General  Level of Consciousness: awake, alert  and oriented  Airway & Oxygen Therapy: Patient Spontanous Breathing and Patient connected to face mask oxygen  Post-op Assessment: Report given to PACU RN and Post -op Vital signs reviewed and stable  Post vital signs: Reviewed and stable  Complications: No apparent anesthesia complications

## 2012-03-16 NOTE — Anesthesia Procedure Notes (Signed)
Procedure Name: LMA Insertion Date/Time: 03/16/2012 11:06 AM Performed by: Norva Pavlov Pre-anesthesia Checklist: Patient identified, Emergency Drugs available, Suction available and Patient being monitored Patient Re-evaluated:Patient Re-evaluated prior to inductionOxygen Delivery Method: Circle System Utilized Preoxygenation: Pre-oxygenation with 100% oxygen Intubation Type: IV induction Ventilation: Mask ventilation without difficulty LMA: LMA inserted LMA Size: 4.0 Number of attempts: 1 Airway Equipment and Method: bite block Placement Confirmation: positive ETCO2 Tube secured with: Tape Dental Injury: Teeth and Oropharynx as per pre-operative assessment

## 2012-03-17 ENCOUNTER — Encounter (HOSPITAL_BASED_OUTPATIENT_CLINIC_OR_DEPARTMENT_OTHER): Payer: Self-pay | Admitting: Urology

## 2012-03-17 NOTE — Anesthesia Postprocedure Evaluation (Signed)
Anesthesia Post Note  Patient: Karen [name]  Procedure(s) Performed: Procedure(s) (LRB): TRANSURETHRAL RESECTION OF BLADDER TUMOR (TURBT) (N/A)  Anesthesia type: General  Patient location: PACU  Post pain: Pain level controlled  Post assessment: Post-op Vital signs reviewed  Last Vitals:  Filed Vitals:   03/16/12 1242  BP: 143/77  Pulse: 87  Temp: 35.8 C  Resp: 18    Post vital signs: Reviewed  Level of consciousness: sedated  Complications: No apparent anesthesia complications

## 2012-03-24 DIAGNOSIS — C679 Malignant neoplasm of bladder, unspecified: Secondary | ICD-10-CM | POA: Diagnosis not present

## 2012-06-26 DIAGNOSIS — E782 Mixed hyperlipidemia: Secondary | ICD-10-CM | POA: Diagnosis not present

## 2012-06-26 DIAGNOSIS — M1990 Unspecified osteoarthritis, unspecified site: Secondary | ICD-10-CM | POA: Diagnosis not present

## 2012-06-26 DIAGNOSIS — Z0000 Encounter for general adult medical examination without abnormal findings: Secondary | ICD-10-CM | POA: Diagnosis not present

## 2012-06-26 DIAGNOSIS — I10 Essential (primary) hypertension: Secondary | ICD-10-CM | POA: Diagnosis not present

## 2012-06-30 DIAGNOSIS — Z8551 Personal history of malignant neoplasm of bladder: Secondary | ICD-10-CM | POA: Diagnosis not present

## 2012-08-10 DIAGNOSIS — I6529 Occlusion and stenosis of unspecified carotid artery: Secondary | ICD-10-CM | POA: Diagnosis not present

## 2012-08-10 DIAGNOSIS — I10 Essential (primary) hypertension: Secondary | ICD-10-CM | POA: Diagnosis not present

## 2012-08-10 DIAGNOSIS — R0602 Shortness of breath: Secondary | ICD-10-CM | POA: Diagnosis not present

## 2012-09-30 DIAGNOSIS — Z8551 Personal history of malignant neoplasm of bladder: Secondary | ICD-10-CM | POA: Diagnosis not present

## 2012-12-24 DIAGNOSIS — F411 Generalized anxiety disorder: Secondary | ICD-10-CM | POA: Diagnosis not present

## 2012-12-24 DIAGNOSIS — Z23 Encounter for immunization: Secondary | ICD-10-CM | POA: Diagnosis not present

## 2012-12-24 DIAGNOSIS — I10 Essential (primary) hypertension: Secondary | ICD-10-CM | POA: Diagnosis not present

## 2013-01-29 DIAGNOSIS — Z8551 Personal history of malignant neoplasm of bladder: Secondary | ICD-10-CM | POA: Diagnosis not present

## 2013-02-22 ENCOUNTER — Encounter: Payer: Self-pay | Admitting: Family

## 2013-02-23 ENCOUNTER — Other Ambulatory Visit (INDEPENDENT_AMBULATORY_CARE_PROVIDER_SITE_OTHER): Payer: Medicare Other | Admitting: Vascular Surgery

## 2013-02-23 ENCOUNTER — Ambulatory Visit (INDEPENDENT_AMBULATORY_CARE_PROVIDER_SITE_OTHER): Payer: Medicare Other | Admitting: Family

## 2013-02-23 ENCOUNTER — Encounter: Payer: Self-pay | Admitting: Family

## 2013-02-23 DIAGNOSIS — Z48812 Encounter for surgical aftercare following surgery on the circulatory system: Secondary | ICD-10-CM | POA: Insufficient documentation

## 2013-02-23 DIAGNOSIS — I6529 Occlusion and stenosis of unspecified carotid artery: Secondary | ICD-10-CM

## 2013-02-23 NOTE — Patient Instructions (Addendum)
Stroke Prevention Some medical conditions and behaviors are associated with an increased chance of having a stroke. You may prevent a stroke by making healthy choices and managing medical conditions. Reduce your risk of having a stroke by:  Staying physically active. Get at least 30 minutes of activity on most or all days.  Not smoking. It may also be helpful to avoid exposure to secondhand smoke.  Limiting alcohol use. Moderate alcohol use is considered to be:  No more than 2 drinks per day for men.  No more than 1 drink per day for nonpregnant women.  Eating healthy foods.  Include 5 or more servings of fruits and vegetables a day.  Certain diets may be prescribed to address high blood pressure, high cholesterol, diabetes, or obesity.  Managing your cholesterol levels.  A low-saturated fat, low-trans fat, low-cholesterol, and high-fiber diet may control cholesterol levels.  Take any prescribed medicines to control cholesterol as directed by your caregiver.  Managing your diabetes.  A controlled-carbohydrate, controlled-sugar diet is recommended to manage diabetes.  Take any prescribed medicines to control diabetes as directed by your caregiver.  Controlling your high blood pressure (hypertension).  A low-salt (sodium), low-saturated fat, low-trans fat, and low-cholesterol diet is recommended to manage high blood pressure.  Take any prescribed medicines to control hypertension as directed by your caregiver.  Maintaining a healthy weight.  A reduced-calorie, low-sodium, low-saturated fat, low-trans fat, low-cholesterol diet is recommended to manage weight.  Stopping drug abuse.  Avoiding birth control pills.  Talk to your caregiver about the risks of taking birth control pills if you are over 35 years old, smoke, get migraines, or have ever had a blood clot.  Getting evaluated for sleep disorders (sleep apnea).  Talk to your caregiver about getting a sleep evaluation  if you snore a lot or have excessive sleepiness.  Taking medicines as directed by your caregiver.  For some people, aspirin or blood thinners (anticoagulants) are helpful in reducing the risk of forming abnormal blood clots that can lead to stroke. If you have the irregular heart rhythm of atrial fibrillation, you should be on a blood thinner unless there is a good reason you cannot take them.  Understand all your medicine instructions. SEEK IMMEDIATE MEDICAL CARE IF:   You have sudden weakness or numbness of the face, arm, or leg, especially on one side of the body.  You have sudden confusion.  You have trouble speaking (aphasia) or understanding.  You have sudden trouble seeing in one or both eyes.  You have sudden trouble walking.  You have dizziness.  You have a loss of balance or coordination.  You have a sudden, severe headache with no known cause.  You have new chest pain or an irregular heartbeat. Any of these symptoms may represent a serious problem that is an emergency. Do not wait to see if the symptoms will go away. Get medical help right away. Call your local emergency services (911 in [place] Do not drive yourself to the hospital. Document Released: 07/04/2004 Document Revised: 08/19/2011 Document Reviewed: 01/14/2011 [employer] Patient Information 2014 [employer].  

## 2013-02-23 NOTE — Progress Notes (Signed)
Established Carotid Patient  History of Present Illness  Karen [name] is a 79 y.o. female patient of Dr. Hart Rochester followed for carotid arteries surveillance status post left CEA in 2011. She returns for scheduled Doppler surveillance of carotid arteries.  Patient denies ever having a stroke, had some dizziness before the CEA, still has some orthostatic hypotension.   history of TIA or stroke symptom.  The patient denies amaurosis fugax or monocular blindness.  The patient  denies facial drooping.  Pt. denies hemiplegia.  The patient denies receptive or expressive aphasia.  Pt. denies extremity weakness. 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  The patient's previous neurologic deficits are Unchanged.   denies New Medical or Surgical History:   Pt Diabetic: No Pt smoker: non-smoker  Pt meds include: Statin : Yes Betablocker: No ASA: Yes Other anticoagulants/antiplatelets: none   Past Medical History  Diagnosis Date  . Hyperlipidemia   . Depression   . Anxiety   . Dyspnea on exertion   . Arthritis knee  . GERD (gastroesophageal reflux disease)   . Hypertension CARDIOLOGIST-- DR Eldridge Dace-  LOV  MARCH 2013--  REQUESTED NOTE AND EKG  . History of endometrial cancer S/P HYSTERECTOMY  2001  . PONV (postoperative nausea and vomiting)   . Hearing loss BILATERAL AIDS  . Occlusion and stenosis of carotid artery without mention of cerebral infarction FOLLOWED BY DR Hart Rochester--  LEFT ICA  0=19%  AND RIGHT ICA  20-395    OCCASIONAL DIZZINESS  . Bladder tumor   . Hematuria     Social History History  Substance Use Topics  . Smoking status: Never Smoker   . Smokeless tobacco: Never Used  . Alcohol Use: No    Family History Family History  Problem Relation Age of Onset  . Coronary artery disease Father   . Hypertension Father   . Hypertension Mother   . Cancer Mother     Colon    Surgical History Past Surgical History  Procedure Laterality  Date  . Carotid endarterectomy  01-19-2010  DR LAWSON    Left CEA with Dacron patch  . Cataract extraction w/ intraocular lens  implant, bilateral    . Vaginal hysterectomy  2001    W/  BILATERAL SALPINGO-OOPHECTOMY FOR ENDOMETRIAL CANCER  . Transthoracic echocardiogram  11-24-2009  DR VARANASI    LVEF  65-70%/  MILD MITRAL, ATRIAL AND TRICUSID REGURG.  . Transurethral resection of bladder tumor  03/16/2012    Procedure: TRANSURETHRAL RESECTION OF BLADDER TUMOR (TURBT);  Surgeon: Garnett Farm, MD;  Location[facility];  Service: Urology;  Laterality: N/A;  1 hour requested for this case  CAMERA GYRUS    Allergies  Allergen Reactions  . Lisinopril Swelling  . Celebrex [Celecoxib] Hives and Itching    Current Outpatient Prescriptions  Medication Sig Dispense Refill  . ALPRAZolam (XANAX) 0.25 MG tablet Take 0.25 mg by mouth at bedtime as needed.      Marland Kitchen AMLODIPINE BESYLATE PO Take 10 mg by mouth every morning.       Marland Kitchen aspirin 325 MG tablet Take 325 mg by mouth daily.      . calcium carbonate (TUMS - DOSED IN MG ELEMENTAL CALCIUM) 500 MG chewable tablet Chew 1 tablet by mouth as needed.      . fish oil-omega-3 fatty acids 1000 MG capsule Take 1,000 mg by mouth daily.      Marland Kitchen HYDROcodone-acetaminophen (NORCO/VICODIN) 5-325 MG per tablet  Take 1 tablet by mouth every 6 (six) hours as needed for pain.  20 tablet  0  . meclizine (ANTIVERT) 12.5 MG tablet Take 12.5 mg by mouth as needed.      . Multiple Vitamin (MULTIVITAMIN) tablet Take 1 tablet by mouth daily.      . phenazopyridine (PYRIDIUM) 200 MG tablet Take 1 tablet (200 mg total) by mouth 3 (three) times daily as needed for pain.  30 tablet  0  . pravastatin (PRAVACHOL) 40 MG tablet Take 40 mg by mouth every evening.        No current facility-administered medications for this visit.    Review of Systems : [x]  Positive   [ ]  Denies  General:[ ]  Weight loss,  [ ]  Weight gain, [ ]  Loss of appetite, [ ]  Fever, [ ]   chills  Neurologic: [ ]  Dizziness, [ ]  Blackouts, [ ]  Headaches, [ ]  Seizure [ ]  Stroke, [ ]  "Mini stroke", [ ]  Slurred speech, [ ]  Temporary blindness;  [ ] weakness,  Ear/Nose/Throat: [ ]  Change in hearing, [ ]  Nose bleeds, [ ]  Hoarseness  Vascular:[ ]  Pain in legs with walking, [ ]  Pain in feet while lying flat , [ ]   Non-healing ulcer, [ ]  Blood clot in vein,    Pulmonary: [ ]  Home oxygen, [ ]   Productive cough, [ ]  Bronchitis, [ ]  Coughing up blood,  [ ]  Asthma, [ ]  Wheezing  Musculoskeletal:  [ ]  Arthritis, Arly.Keller ] Joint pain, [ ]  low back pain. Positive for sciatica, relieved by ESI years ago  Cardiac: [ ]  Chest pain, [ ]  Shortness of breath when lying flat, [ ]  Shortness of breath with exertion, [ ]  Palpitations, [ ]  Heart murmur, [ ]   Atrial fibrillation  Hematologic:[ ]  Easy Bruising, [ ]  Anemia; [ ]  Hepatitis  Psychiatric: [ ]   Depression, [ ]  Anxiety   Gastrointestinal: [ ]  Black stool, [ ]  Blood in stool, [ ]  Peptic ulcer disease,  [ ]  Gastroesophageal Reflux, [ ]  Trouble swallowing, [ ]  Diarrhea, [ ]  Constipation  Urinary: [ ]  chronic Kidney disease, [ ]  on HD, [ ]  Burning with urination, [ ]  Frequent urination, [ ]  Difficulty urinating; Positive for bladder cancer treated by cystoscopy.  Skin: [ ]  Rashes, [ ]  Wounds    Physical Examination  Filed Vitals:   02/23/13 1353  BP: 148/75  Pulse: 91  Temp: 97.6 F (36.4 C)  Resp: 18   Body mass index is 25.05 kg/(m^2). Filed Weights   02/23/13 1353  Weight: 146 lb (66.225 kg)    General: WDWN female in NAD GAIT: normal Eyes: PERRLA Pulmonary:  CTAB, Negative  Rales, Negative rhonchi, & Negative wheezing.  Cardiac: regular Rhythm ,  Negative Murmurs.  VASCULAR EXAM Carotid Bruits Left Right   Negative Negative  LE Pulses LEFT RIGHT       FEMORAL   palpable   palpable        POPLITEAL   not palpable   not palpable       POSTERIOR TIBIAL   palpable    palpable        DORSALIS PEDIS      ANTERIOR TIBIAL  palpable   palpable       Gastrointestinal: soft, nontender, BS WNL, no r/g,  negative masses.  Musculoskeletal: Negative muscle atrophy/wasting. M/S 5/5 throughout, Extremities without ischemic changes.  Neurologic: A&O X 3; Appropriate Affect ; SENSATION ;normal;  Speech is normal CN 2-12 intact except she is significantly hard of hearing, Pain and light touch intact in extremities, Motor exam as listed above.Positive for hard of hearing.   Non-Invasive Vascular Imaging CAROTID DUPLEX 02/23/2013   Right ICA <40% stenosis Left ICA patent s/p CEA with no evidence of restenosis  These findings are Unchanged from previous exam  Assessment: Karen [name] is a 79 y.o. female who presents with asymptomatic <40% Right ICA  Stenosis and patent LICA s/p CEA. The  ICA stenosis is  Unchanged from previous exam. Her overall health and functional status is remarkable for age 16.  Plan: Follow-up in 1 year with Carotid Duplex scan.  I discussed in depth with the patient the nature of atherosclerosis, and emphasized the importance of maximal medical management including strict control of blood pressure, blood glucose, and lipid levels, obtaining regular exercise, and continued cessation of smoking.  The patient is aware that without maximal medical management the underlying atherosclerotic disease process will progress, limiting the benefit of any interventions. The patient was given information about stroke prevention and what symptoms should prompt the patient to seek immediate medical care. Thank you for allowing [place] to participate in this patient's care.  Charisse March, RN, MSN, FNP-C [facility] Office: [phone]  Clinic Physician: Hart Rochester  02/23/2013 1:32 PM

## 2013-06-28 DIAGNOSIS — I10 Essential (primary) hypertension: Secondary | ICD-10-CM | POA: Diagnosis not present

## 2013-06-28 DIAGNOSIS — Z23 Encounter for immunization: Secondary | ICD-10-CM | POA: Diagnosis not present

## 2013-06-28 DIAGNOSIS — Z0000 Encounter for general adult medical examination without abnormal findings: Secondary | ICD-10-CM | POA: Diagnosis not present

## 2013-08-02 ENCOUNTER — Other Ambulatory Visit: Payer: Self-pay | Admitting: Family

## 2013-08-02 DIAGNOSIS — I6529 Occlusion and stenosis of unspecified carotid artery: Secondary | ICD-10-CM

## 2013-08-02 DIAGNOSIS — Z48812 Encounter for surgical aftercare following surgery on the circulatory system: Secondary | ICD-10-CM

## 2013-08-03 ENCOUNTER — Other Ambulatory Visit: Payer: Self-pay | Admitting: Interventional Cardiology

## 2013-08-12 DIAGNOSIS — Z8551 Personal history of malignant neoplasm of bladder: Secondary | ICD-10-CM | POA: Diagnosis not present

## 2013-11-02 ENCOUNTER — Other Ambulatory Visit: Payer: Self-pay | Admitting: Interventional Cardiology

## 2013-12-27 DIAGNOSIS — E782 Mixed hyperlipidemia: Secondary | ICD-10-CM | POA: Diagnosis not present

## 2013-12-27 DIAGNOSIS — I10 Essential (primary) hypertension: Secondary | ICD-10-CM | POA: Diagnosis not present

## 2013-12-27 DIAGNOSIS — N183 Chronic kidney disease, stage 3 unspecified: Secondary | ICD-10-CM | POA: Diagnosis not present

## 2014-02-21 DIAGNOSIS — C671 Malignant neoplasm of dome of bladder: Secondary | ICD-10-CM | POA: Diagnosis not present

## 2014-02-23 ENCOUNTER — Ambulatory Visit: Payer: Medicare Other | Admitting: Family

## 2014-02-23 ENCOUNTER — Other Ambulatory Visit (HOSPITAL_COMMUNITY): Payer: Medicare Other

## 2014-03-28 ENCOUNTER — Encounter: Payer: Self-pay | Admitting: Family

## 2014-03-29 ENCOUNTER — Ambulatory Visit (INDEPENDENT_AMBULATORY_CARE_PROVIDER_SITE_OTHER): Payer: Medicare Other | Admitting: Family

## 2014-03-29 ENCOUNTER — Encounter: Payer: Self-pay | Admitting: Family

## 2014-03-29 ENCOUNTER — Ambulatory Visit (HOSPITAL_COMMUNITY)
Admission: RE | Admit: 2014-03-29 | Discharge: 2014-03-29 | Disposition: A | Discharge status: Home / Self Care | Payer: Medicare Other | Source: Ambulatory Visit | Attending: Family | Admitting: Family

## 2014-03-29 VITALS — BP 120/68 | HR 99 | Resp 16 | Ht 62.0 in | Wt 140.0 lb

## 2014-03-29 DIAGNOSIS — Z48812 Encounter for surgical aftercare following surgery on the circulatory system: Secondary | ICD-10-CM | POA: Insufficient documentation

## 2014-03-29 DIAGNOSIS — I6523 Occlusion and stenosis of bilateral carotid arteries: Secondary | ICD-10-CM

## 2014-03-29 DIAGNOSIS — I6529 Occlusion and stenosis of unspecified carotid artery: Secondary | ICD-10-CM | POA: Diagnosis not present

## 2014-03-29 NOTE — Progress Notes (Signed)
Established Carotid Patient   History of Present Illness  Karen [name] is a 80 y.o. female patient of Dr. Kellie Simmering followed for carotid arteries surveillance status post left CEA in 2011.  She returns for scheduled Doppler surveillance of carotid arteries.  Patient denies ever having a stroke, had some dizziness before the CEA, still has some orthostatic hypotension.  The patient denies amaurosis fugax or monocular blindness. The patient denies facial drooping.  Pt. denies hemiplegia. The patient denies receptive or expressive aphasia.  Is hard of hearing, attributes to working in a [employer] for a long time.   The patient denies claudication symptoms with walking, denies new medical or surgical history  Pt Diabetic: No  Pt smoker: non-smoker   Pt meds include:  Statin : Yes  Betablocker: No  ASA: Yes  Other anticoagulants/antiplatelets: none   Past Medical History  Diagnosis Date  . Hyperlipidemia   . Depression   . Anxiety   . Dyspnea on exertion   . Arthritis knee  . GERD (gastroesophageal reflux disease)   . Hypertension CARDIOLOGIST-- DR Irish Lack-  LOV  MARCH 2013--  REQUESTED NOTE AND EKG  . History of endometrial cancer S/P HYSTERECTOMY  2001  . PONV (postoperative nausea and vomiting)   . Hearing loss BILATERAL AIDS  . Occlusion and stenosis of carotid artery without mention of cerebral infarction FOLLOWED BY DR Kellie Simmering--  LEFT ICA  0=19%  AND RIGHT ICA  20-395    OCCASIONAL DIZZINESS  . Bladder tumor   . Hematuria     Social History History  Substance Use Topics  . Smoking status: Never Smoker   . Smokeless tobacco: Never Used  . Alcohol Use: No    Family History Family History  Problem Relation Age of Onset  . Coronary artery disease Father   . Hypertension Father   . Hypertension Mother   . Cancer Mother     Colon    Surgical History Past Surgical History  Procedure Laterality Date  . Carotid endarterectomy  01-19-2010  DR LAWSON    Left CEA with  Dacron patch  . Cataract extraction w/ intraocular lens  implant, bilateral    . Vaginal hysterectomy  2001    W/  BILATERAL SALPINGO-OOPHECTOMY FOR ENDOMETRIAL CANCER  . Transthoracic echocardiogram  11-24-2009  DR VARANASI    LVEF  65-70%/  MILD MITRAL, ATRIAL AND TRICUSID REGURG.  . Transurethral resection of bladder tumor  03/16/2012    Procedure: TRANSURETHRAL RESECTION OF BLADDER TUMOR (TURBT);  Surgeon: Claybon Jabs, MD;  Location: [facility];  Service: Urology;  Laterality: N/A;  1 hour requested for this case  CAMERA GYRUS    Allergies  Allergen Reactions  . Lisinopril Swelling  . Celebrex [Celecoxib] Hives and Itching    Current Outpatient Prescriptions  Medication Sig Dispense Refill  . ALPRAZolam (XANAX) 0.25 MG tablet Take 0.25 mg by mouth at bedtime as needed.      Marland Kitchen AMLODIPINE BESYLATE PO Take 10 mg by mouth every morning.       Marland Kitchen aspirin 325 MG tablet Take 325 mg by mouth daily.      . fish oil-omega-3 fatty acids 1000 MG capsule Take 1,000 mg by mouth daily.      . meclizine (ANTIVERT) 12.5 MG tablet Take 12.5 mg by mouth as needed.      . pravastatin (PRAVACHOL) 40 MG tablet TAKE 1 TABLET BY MOUTH DAILY  90 tablet  2  . calcium carbonate (TUMS - DOSED  IN MG ELEMENTAL CALCIUM) 500 MG chewable tablet Chew 1 tablet by mouth as needed.      Marland Kitchen HYDROcodone-acetaminophen (NORCO/VICODIN) 5-325 MG per tablet Take 1 tablet by mouth every 6 (six) hours as needed for pain.  20 tablet  0  . Multiple Vitamin (MULTIVITAMIN) tablet Take 1 tablet by mouth daily.      . phenazopyridine (PYRIDIUM) 200 MG tablet Take 1 tablet (200 mg total) by mouth 3 (three) times daily as needed for pain.  30 tablet  0   No current facility-administered medications for this visit.    Review of Systems : See HPI for pertinent positives and negatives.  Physical Examination  Filed Vitals:   03/29/14 1423 03/29/14 1426  BP: 134/71 120/68  Pulse: 91 99  Resp:  16  Height:  5'  2" (1.575 m)  Weight:  140 lb (63.504 kg)  SpO2:  96%   Body mass index is 25.6 kg/(m^2).  General: WDWN female in NAD  GAIT: normal  Eyes: PERRLA  Pulmonary: CTAB, Negative Rales, Negative rhonchi, & Negative wheezing.  Cardiac: regular Rhythm , no detected Murmur.   VASCULAR EXAM  Carotid Bruits  Left  Right    Negative  Negative    Bilateral radial pulses are 2+ and =  LE Pulses  LEFT  RIGHT   FEMORAL  palpable  palpable   POPLITEAL  not palpable  not palpable   POSTERIOR TIBIAL  palpable  palpable   DORSALIS PEDIS  ANTERIOR TIBIAL  palpable  palpable    Gastrointestinal: soft, nontender, BS WNL, no r/g, no palpable  masses.  Musculoskeletal: Negative muscle atrophy/wasting. M/S 5/5 throughout, extremities without ischemic changes.  Neurologic: A&O X 3; Appropriate Affect ; SENSATION ;normal;  Speech is normal  CN 2-12 intact except she is significantly hard of hearing, pain and light touch intact in extremities, motor exam as listed above.   Non-Invasive Vascular Imaging CAROTID DUPLEX 03/29/2014   CEREBROVASCULAR DUPLEX EVALUATION    INDICATION: Carotid artery disease     PREVIOUS INTERVENTION(S): Left carotid endarterectomy 06/11/2009.    DUPLEX EXAM:     RIGHT  LEFT  Peak Systolic Velocities (cm/s) End Diastolic Velocities (cm/s) Plaque LOCATION Peak Systolic Velocities (cm/s) End Diastolic Velocities (cm/s) Plaque  68 11  CCA PROXIMAL 65 12   65 13  CCA MID 72 9   67 12 HT CCA DISTAL 66 9   75 4 HT ECA 162 11 HT  64 12 HT ICA PROXIMAL 62 14   91 21  ICA MID 44 8   66 15  ICA DISTAL 49 11     1.4 ICA / CCA Ratio (PSV)   Antegrade  Vertebral Flow Antegrade   341 Brachial Systolic Pressure (mmHg) 962  Triphasic  Brachial Artery Waveforms Triphasic     Plaque Morphology:  HM = Homogeneous, HT = Heterogeneous, CP = Calcific Plaque, SP = Smooth Plaque, IP = Irregular Plaque     ADDITIONAL FINDINGS:     IMPRESSION: Right internal carotid artery  velocities suggest a <40% stenosis.  Patent left carotid endarterectomy site with no evidence of hyperplasia or restenosis.     Compared to the previous exam:  No significant change in comparison to the last exam on 02/23/2013.      Assessment: Karen [name] is a 80 y.o. female who is status post left CEA in 2011.  She presents with asymptomatic minimal right ICA stenosis and patent left carotid endarterectomy site with no evidence  of hyperplasia or restenosis. No significant change in comparison to the last exam on 02/23/2013.   Plan: Follow-up in 1 year with Carotid Duplex scan.   I discussed in depth with the patient the nature of atherosclerosis, and emphasized the importance of maximal medical management including strict control of blood pressure, blood glucose, and lipid levels, obtaining regular exercise, and continued cessation of smoking.  The patient is aware that without maximal medical management the underlying atherosclerotic disease process will progress, limiting the benefit of any interventions. The patient was given information about stroke prevention and what symptoms should prompt the patient to seek immediate medical care. Thank you for allowing [place] to participate in this patient's care.  Clemon Chambers, RN, MSN, FNP-C [facility] Office: [phone]  Clinic Physician: Kellie Simmering  03/29/2014 2:48 PM

## 2014-03-29 NOTE — Patient Instructions (Signed)
Stroke Prevention Some medical conditions and behaviors are associated with an increased chance of having a stroke. You may prevent a stroke by making healthy choices and managing medical conditions. HOW CAN I REDUCE MY RISK OF HAVING A STROKE?   Stay physically active. Get at least 30 minutes of activity on most or all days.  Do not smoke. It may also be helpful to avoid exposure to secondhand smoke.  Limit alcohol use. Moderate alcohol use is considered to be:  No more than 2 drinks per day for men.  No more than 1 drink per day for nonpregnant women.  Eat healthy foods. This involves:  Eating 5 or more servings of fruits and vegetables a day.  Making dietary changes that address high blood pressure (hypertension), high cholesterol, diabetes, or obesity.  Manage your cholesterol levels.  Making food choices that are high in fiber and low in saturated fat, trans fat, and cholesterol may control cholesterol levels.  Take any prescribed medicines to control cholesterol as directed by your health care provider.  Manage your diabetes.  Controlling your carbohydrate and sugar intake is recommended to manage diabetes.  Take any prescribed medicines to control diabetes as directed by your health care provider.  Control your hypertension.  Making food choices that are low in salt (sodium), saturated fat, trans fat, and cholesterol is recommended to manage hypertension.  Take any prescribed medicines to control hypertension as directed by your health care provider.  Maintain a healthy weight.  Reducing calorie intake and making food choices that are low in sodium, saturated fat, trans fat, and cholesterol are recommended to manage weight.  Stop drug abuse.  Avoid taking birth control pills.  Talk to your health care provider about the risks of taking birth control pills if you are over 35 years old, smoke, get migraines, or have ever had a blood clot.  Get evaluated for sleep  disorders (sleep apnea).  Talk to your health care provider about getting a sleep evaluation if you snore a lot or have excessive sleepiness.  Take medicines only as directed by your health care provider.  For some people, aspirin or blood thinners (anticoagulants) are helpful in reducing the risk of forming abnormal blood clots that can lead to stroke. If you have the irregular heart rhythm of atrial fibrillation, you should be on a blood thinner unless there is a good reason you cannot take them.  Understand all your medicine instructions.  Make sure that other conditions (such as anemia or atherosclerosis) are addressed. SEEK IMMEDIATE MEDICAL CARE IF:   You have sudden weakness or numbness of the face, arm, or leg, especially on one side of the body.  Your face or eyelid droops to one side.  You have sudden confusion.  You have trouble speaking (aphasia) or understanding.  You have sudden trouble seeing in one or both eyes.  You have sudden trouble walking.  You have dizziness.  You have a loss of balance or coordination.  You have a sudden, severe headache with no known cause.  You have new chest pain or an irregular heartbeat. Any of these symptoms may represent a serious problem that is an emergency. Do not wait to see if the symptoms will go away. Get medical help at once. Call your local emergency services (911 in [place] Do not drive yourself to the hospital. Document Released: 07/04/2004 Document Revised: 10/11/2013 Document Reviewed: 11/27/2012 ExitCare Patient Information 2015 [employer]. This information is not intended to replace advice given   to you by your health care provider. Make sure you discuss any questions you have with your health care provider.  

## 2014-03-30 NOTE — Addendum Note (Signed)
Addended by: Mena Goes on: 03/30/2014 09:16 AM   Modules accepted: Orders

## 2014-07-04 DIAGNOSIS — I10 Essential (primary) hypertension: Secondary | ICD-10-CM | POA: Diagnosis not present

## 2014-07-04 DIAGNOSIS — I779 Disorder of arteries and arterioles, unspecified: Secondary | ICD-10-CM | POA: Diagnosis not present

## 2014-07-04 DIAGNOSIS — Z0000 Encounter for general adult medical examination without abnormal findings: Secondary | ICD-10-CM | POA: Diagnosis not present

## 2014-07-04 DIAGNOSIS — Z1389 Encounter for screening for other disorder: Secondary | ICD-10-CM | POA: Diagnosis not present

## 2014-07-04 DIAGNOSIS — E785 Hyperlipidemia, unspecified: Secondary | ICD-10-CM | POA: Diagnosis not present

## 2014-07-04 DIAGNOSIS — F5101 Primary insomnia: Secondary | ICD-10-CM | POA: Diagnosis not present

## 2014-07-15 DIAGNOSIS — E785 Hyperlipidemia, unspecified: Secondary | ICD-10-CM | POA: Diagnosis not present

## 2014-07-15 DIAGNOSIS — I10 Essential (primary) hypertension: Secondary | ICD-10-CM | POA: Diagnosis not present

## 2014-08-23 DIAGNOSIS — Z8551 Personal history of malignant neoplasm of bladder: Secondary | ICD-10-CM | POA: Diagnosis not present

## 2015-01-02 DIAGNOSIS — I10 Essential (primary) hypertension: Secondary | ICD-10-CM | POA: Diagnosis not present

## 2015-01-02 DIAGNOSIS — F411 Generalized anxiety disorder: Secondary | ICD-10-CM | POA: Diagnosis not present

## 2015-02-28 DIAGNOSIS — Z8551 Personal history of malignant neoplasm of bladder: Secondary | ICD-10-CM | POA: Diagnosis not present

## 2015-03-31 ENCOUNTER — Encounter: Payer: Self-pay | Admitting: Family

## 2015-04-04 ENCOUNTER — Encounter (HOSPITAL_COMMUNITY): Payer: Medicare Other

## 2015-04-04 ENCOUNTER — Ambulatory Visit: Payer: Medicare Other | Admitting: Family

## 2015-04-11 ENCOUNTER — Encounter: Payer: Self-pay | Admitting: Family

## 2015-04-13 ENCOUNTER — Ambulatory Visit: Payer: Medicare Other | Admitting: Family

## 2015-04-13 ENCOUNTER — Ambulatory Visit (HOSPITAL_COMMUNITY)
Admission: RE | Admit: 2015-04-13 | Discharge: 2015-04-13 | Disposition: A | Discharge status: Home / Self Care | Payer: Medicare Other | Source: Ambulatory Visit | Attending: Family | Admitting: Family

## 2015-04-13 DIAGNOSIS — I6521 Occlusion and stenosis of right carotid artery: Secondary | ICD-10-CM | POA: Diagnosis not present

## 2015-04-13 DIAGNOSIS — E785 Hyperlipidemia, unspecified: Secondary | ICD-10-CM | POA: Diagnosis not present

## 2015-04-13 DIAGNOSIS — Z48812 Encounter for surgical aftercare following surgery on the circulatory system: Secondary | ICD-10-CM | POA: Diagnosis not present

## 2015-04-13 DIAGNOSIS — I10 Essential (primary) hypertension: Secondary | ICD-10-CM | POA: Insufficient documentation

## 2015-04-13 DIAGNOSIS — I6523 Occlusion and stenosis of bilateral carotid arteries: Secondary | ICD-10-CM | POA: Diagnosis not present

## 2015-04-14 ENCOUNTER — Encounter: Payer: Self-pay | Admitting: Family

## 2015-04-18 ENCOUNTER — Ambulatory Visit (INDEPENDENT_AMBULATORY_CARE_PROVIDER_SITE_OTHER): Payer: Medicare Other | Admitting: Family

## 2015-04-18 ENCOUNTER — Encounter: Payer: Self-pay | Admitting: Family

## 2015-04-18 VITALS — BP 140/73 | HR 85 | Ht 62.0 in | Wt 135.0 lb

## 2015-04-18 DIAGNOSIS — Z48812 Encounter for surgical aftercare following surgery on the circulatory system: Secondary | ICD-10-CM | POA: Diagnosis not present

## 2015-04-18 DIAGNOSIS — Z9889 Other specified postprocedural states: Secondary | ICD-10-CM | POA: Diagnosis not present

## 2015-04-18 DIAGNOSIS — I6523 Occlusion and stenosis of bilateral carotid arteries: Secondary | ICD-10-CM | POA: Diagnosis not present

## 2015-04-18 NOTE — Patient Instructions (Signed)
Stroke Prevention Some medical conditions and behaviors are associated with an increased chance of having a stroke. You may prevent a stroke by making healthy choices and managing medical conditions. HOW CAN I REDUCE MY RISK OF HAVING A STROKE?   Stay physically active. Get at least 30 minutes of activity on most or all days.  Do not smoke. It may also be helpful to avoid exposure to secondhand smoke.  Limit alcohol use. Moderate alcohol use is considered to be:  No more than 2 drinks per day for men.  No more than 1 drink per day for nonpregnant women.  Eat healthy foods. This involves:  Eating 5 or more servings of fruits and vegetables a day.  Making dietary changes that address high blood pressure (hypertension), high cholesterol, diabetes, or obesity.  Manage your cholesterol levels.  Making food choices that are high in fiber and low in saturated fat, trans fat, and cholesterol may control cholesterol levels.  Take any prescribed medicines to control cholesterol as directed by your health care provider.  Manage your diabetes.  Controlling your carbohydrate and sugar intake is recommended to manage diabetes.  Take any prescribed medicines to control diabetes as directed by your health care provider.  Control your hypertension.  Making food choices that are low in salt (sodium), saturated fat, trans fat, and cholesterol is recommended to manage hypertension.  Ask your health care provider if you need treatment to lower your blood pressure. Take any prescribed medicines to control hypertension as directed by your health care provider.  If you are 18-39 years of age, have your blood pressure checked every 3-5 years. If you are 40 years of age or older, have your blood pressure checked every year.  Maintain a healthy weight.  Reducing calorie intake and making food choices that are low in sodium, saturated fat, trans fat, and cholesterol are recommended to manage  weight.  Stop drug abuse.  Avoid taking birth control pills.  Talk to your health care provider about the risks of taking birth control pills if you are over 35 years old, smoke, get migraines, or have ever had a blood clot.  Get evaluated for sleep disorders (sleep apnea).  Talk to your health care provider about getting a sleep evaluation if you snore a lot or have excessive sleepiness.  Take medicines only as directed by your health care provider.  For some people, aspirin or blood thinners (anticoagulants) are helpful in reducing the risk of forming abnormal blood clots that can lead to stroke. If you have the irregular heart rhythm of atrial fibrillation, you should be on a blood thinner unless there is a good reason you cannot take them.  Understand all your medicine instructions.  Make sure that other conditions (such as anemia or atherosclerosis) are addressed. SEEK IMMEDIATE MEDICAL CARE IF:   You have sudden weakness or numbness of the face, arm, or leg, especially on one side of the body.  Your face or eyelid droops to one side.  You have sudden confusion.  You have trouble speaking (aphasia) or understanding.  You have sudden trouble seeing in one or both eyes.  You have sudden trouble walking.  You have dizziness.  You have a loss of balance or coordination.  You have a sudden, severe headache with no known cause.  You have new chest pain or an irregular heartbeat. Any of these symptoms may represent a serious problem that is an emergency. Do not wait to see if the symptoms will   go away. Get medical help at once. Call your local emergency services (911 in [place] Do not drive yourself to the hospital.   This information is not intended to replace advice given to you by your health care provider. Make sure you discuss any questions you have with your health care provider.   Document Released: 07/04/2004 Document Revised: 06/17/2014 Document Reviewed:  11/27/2012 Elsevier Interactive Patient Education 2016 Elsevier Inc.  

## 2015-04-18 NOTE — Progress Notes (Signed)
Established Carotid Patient   History of Present Illness  Karen [name] is a 81 y.o. female patient of Dr. Kellie Simmering followed for carotid arteries surveillance status post left CEA in 2011.  She returns for scheduled Doppler surveillance of carotid arteries.  Patient denies ever having a stroke, had some dizziness before the CEA, still has some orthostatic hypotension.  The patient denies a history of amaurosis fugax or monocular blindness, unilateral facial drooping, hemiplegia or receptive or expressive aphasia.  She is hard of hearing, attributes to working in a [employer] for a long time.   The patient denies claudication symptoms with walking, denies new medical or surgical history  Pt Diabetic: No  Pt smoker: non-smoker   Pt meds include:  Statin : Yes  Betablocker: No  ASA: Yes  Other anticoagulants/antiplatelets: none    Past Medical History  Diagnosis Date  . Hyperlipidemia   . Depression   . Anxiety   . Dyspnea on exertion   . Arthritis knee  . GERD (gastroesophageal reflux disease)   . Hypertension CARDIOLOGIST-- DR Irish Lack-  LOV  MARCH 2013--  REQUESTED NOTE AND EKG  . History of endometrial cancer S/P HYSTERECTOMY  2001  . PONV (postoperative nausea and vomiting)   . Hearing loss BILATERAL AIDS  . Occlusion and stenosis of carotid artery without mention of cerebral infarction FOLLOWED BY DR Kellie Simmering--  LEFT ICA  0=19%  AND RIGHT ICA  20-395    OCCASIONAL DIZZINESS  . Bladder tumor   . Hematuria     Social History Social History  Substance Use Topics  . Smoking status: Never Smoker   . Smokeless tobacco: Never Used  . Alcohol Use: No    Family History Family History  Problem Relation Age of Onset  . Coronary artery disease Father   . Hypertension Father   . Hypertension Mother   . Cancer Mother     Colon    Surgical History Past Surgical History  Procedure Laterality Date  . Carotid endarterectomy  01-19-2010  DR LAWSON    Left CEA with  Dacron patch  . Cataract extraction w/ intraocular lens  implant, bilateral    . Vaginal hysterectomy  2001    W/  BILATERAL SALPINGO-OOPHECTOMY FOR ENDOMETRIAL CANCER  . Transthoracic echocardiogram  11-24-2009  DR VARANASI    LVEF  65-70%/  MILD MITRAL, ATRIAL AND TRICUSID REGURG.  . Transurethral resection of bladder tumor  03/16/2012    Procedure: TRANSURETHRAL RESECTION OF BLADDER TUMOR (TURBT);  Surgeon: Claybon Jabs, MD;  Location: [facility];  Service: Urology;  Laterality: N/A;  1 hour requested for this case  CAMERA GYRUS    Allergies  Allergen Reactions  . Lisinopril Swelling  . Celebrex [Celecoxib] Hives and Itching    Current Outpatient Prescriptions  Medication Sig Dispense Refill  . ALPRAZolam (XANAX) 0.25 MG tablet Take 0.25 mg by mouth at bedtime as needed.    Marland Kitchen AMLODIPINE BESYLATE PO Take 10 mg by mouth every morning.     Marland Kitchen aspirin 325 MG tablet Take 325 mg by mouth daily.    . calcium carbonate (TUMS - DOSED IN MG ELEMENTAL CALCIUM) 500 MG chewable tablet Chew 1 tablet by mouth as needed.    . fish oil-omega-3 fatty acids 1000 MG capsule Take 1,000 mg by mouth daily.    Marland Kitchen HYDROcodone-acetaminophen (NORCO/VICODIN) 5-325 MG per tablet Take 1 tablet by mouth every 6 (six) hours as needed for pain. 20 tablet 0  . meclizine (ANTIVERT)  12.5 MG tablet Take 12.5 mg by mouth as needed.    . Multiple Vitamin (MULTIVITAMIN) tablet Take 1 tablet by mouth daily.    . phenazopyridine (PYRIDIUM) 200 MG tablet Take 1 tablet (200 mg total) by mouth 3 (three) times daily as needed for pain. 30 tablet 0  . pravastatin (PRAVACHOL) 40 MG tablet TAKE 1 TABLET BY MOUTH DAILY 90 tablet 2   No current facility-administered medications for this visit.    Review of Systems : See HPI for pertinent positives and negatives.  Physical Examination  Filed Vitals:   04/18/15 1026 04/18/15 1028  BP: 149/71 140/73  Pulse: 85   Height: 5\' 2"  (1.575 m)   Weight: 135 lb  (61.236 kg)   SpO2: 97%    Body mass index is 24.69 kg/(m^2).   General: WDWN female in NAD  GAIT: normal  Eyes: PERRLA  Pulmonary: CTAB, no rales, rhonchi, or wheezing.  Cardiac: regular rhythm , no detected murmur.   VASCULAR EXAM  Carotid Bruits  Left  Right    Negative  Negative    Bilateral radial pulses are 2+ and =  LE Pulses  LEFT  RIGHT   FEMORAL  palpable  palpable   POPLITEAL  not palpable  not palpable   POSTERIOR TIBIAL  palpable  palpable   DORSALIS PEDIS  ANTERIOR TIBIAL  palpable  palpable    Gastrointestinal: soft, nontender, BS WNL, no r/g, no palpable masses.  Musculoskeletal: No muscle atrophy/wasting. M/S 5/5 throughout, extremities without ischemic changes.  Neurologic: A&O X 3; Appropriate Affect, normal sensation, Speech is normal  CN 2-12 intact except she is significantly hard of hearing, pain and light touch intact in extremities, motor exam as listed above.         Non-Invasive Vascular Imaging CAROTID DUPLEX 04/13/2015   CEREBROVASCULAR DUPLEX EVALUATION    INDICATION: Follow-up carotid disease     PREVIOUS INTERVENTION(S): Left carotid endarterectomy 06/11/2009    DUPLEX EXAM:     RIGHT  LEFT  Peak Systolic Velocities (cm/s) End Diastolic Velocities (cm/s) Plaque LOCATION Peak Systolic Velocities (cm/s) End Diastolic Velocities (cm/s) Plaque  84 11  CCA PROXIMAL 100 13   80 13  CCA MID 82 12   80 12  CCA DISTAL 67 9   96 0 HT ECA 165 4 HT  54 11 HT ICA PROXIMAL 48 11   59 13  ICA MID 72 14   71 12  ICA DISTAL 66 17     0.68 ICA / CCA Ratio (PSV) NA  Antegrade  Vertebral Flow Antegrade    Brachial Systolic Pressure (mmHg)   Within normal limits  Subclavian Artery Waveforms Within normal limits     Plaque Morphology:  HM = Homogeneous, HT = Heterogeneous, CP = Calcific Plaque, SP = Smooth Plaque, IP = Irregular Plaque  ADDITIONAL FINDINGS:     IMPRESSION: 1. Evidence of <40% stenosis of the  right internal carotid artery. 2. Patent left carotid endarterectomy without evidence of restenosis; mild hyperplasia is observed. 3. Bilateral vertebral artery is antegrade.     Compared to the previous exam:  No significant change compared to prior exam.      Assessment: Karen [name] is a 81 y.o. female who is status post left CEA in 2011. She has no history of stroke or TIA. 04/13/15 carotid duplex suggests <40% stenosis of the right internal carotid artery and patent left carotid endarterectomy without evidence of restenosis; mild hyperplasia is observed. Bilateral vertebral artery  is antegrade. No significant change compared to prior exam a year ago.    Plan: Follow-up in 1 year with Carotid Duplex scan.   I discussed in depth with the patient the nature of atherosclerosis, and emphasized the importance of maximal medical management including strict control of blood pressure, blood glucose, and lipid levels, obtaining regular exercise, and continued cessation of smoking.  The patient is aware that without maximal medical management the underlying atherosclerotic disease process will progress, limiting the benefit of any interventions. The patient was given information about stroke prevention and what symptoms should prompt the patient to seek immediate medical care. Thank you for allowing [place] to participate in this patient's care.  Clemon Chambers, RN, MSN, FNP-C [facility] Office: [facility] Physician: Kellie Simmering  04/18/2015 10:16 AM

## 2015-04-18 NOTE — Addendum Note (Signed)
Addended by: Dorthula Rue L on: 04/18/2015 11:10 AM   Modules accepted: Orders

## 2015-08-07 DIAGNOSIS — E785 Hyperlipidemia, unspecified: Secondary | ICD-10-CM | POA: Diagnosis not present

## 2015-08-07 DIAGNOSIS — I779 Disorder of arteries and arterioles, unspecified: Secondary | ICD-10-CM | POA: Diagnosis not present

## 2015-08-07 DIAGNOSIS — Z0000 Encounter for general adult medical examination without abnormal findings: Secondary | ICD-10-CM | POA: Diagnosis not present

## 2015-08-07 DIAGNOSIS — I10 Essential (primary) hypertension: Secondary | ICD-10-CM | POA: Diagnosis not present

## 2015-08-07 DIAGNOSIS — F411 Generalized anxiety disorder: Secondary | ICD-10-CM | POA: Diagnosis not present

## 2015-08-07 DIAGNOSIS — Z1389 Encounter for screening for other disorder: Secondary | ICD-10-CM | POA: Diagnosis not present

## 2015-08-07 DIAGNOSIS — R202 Paresthesia of skin: Secondary | ICD-10-CM | POA: Diagnosis not present

## 2015-08-10 DIAGNOSIS — D0439 Carcinoma in situ of skin of other parts of face: Secondary | ICD-10-CM | POA: Diagnosis not present

## 2015-08-10 DIAGNOSIS — L570 Actinic keratosis: Secondary | ICD-10-CM | POA: Diagnosis not present

## 2015-08-10 DIAGNOSIS — D485 Neoplasm of uncertain behavior of skin: Secondary | ICD-10-CM | POA: Diagnosis not present

## 2015-09-01 DIAGNOSIS — J209 Acute bronchitis, unspecified: Secondary | ICD-10-CM | POA: Diagnosis not present

## 2015-09-26 DIAGNOSIS — R350 Frequency of micturition: Secondary | ICD-10-CM | POA: Diagnosis not present

## 2015-09-26 DIAGNOSIS — Z0000 Encounter for general adult medical examination without abnormal findings: Secondary | ICD-10-CM | POA: Diagnosis not present

## 2015-09-26 DIAGNOSIS — N3281 Overactive bladder: Secondary | ICD-10-CM | POA: Diagnosis not present

## 2015-11-09 DIAGNOSIS — N3281 Overactive bladder: Secondary | ICD-10-CM | POA: Diagnosis not present

## 2016-02-26 DIAGNOSIS — N3281 Overactive bladder: Secondary | ICD-10-CM | POA: Diagnosis not present

## 2016-02-26 DIAGNOSIS — I10 Essential (primary) hypertension: Secondary | ICD-10-CM | POA: Diagnosis not present

## 2016-02-26 DIAGNOSIS — F411 Generalized anxiety disorder: Secondary | ICD-10-CM | POA: Diagnosis not present

## 2016-02-26 DIAGNOSIS — I779 Disorder of arteries and arterioles, unspecified: Secondary | ICD-10-CM | POA: Diagnosis not present

## 2016-02-26 DIAGNOSIS — E785 Hyperlipidemia, unspecified: Secondary | ICD-10-CM | POA: Diagnosis not present

## 2016-03-05 DIAGNOSIS — R350 Frequency of micturition: Secondary | ICD-10-CM | POA: Diagnosis not present

## 2016-03-05 DIAGNOSIS — Z8551 Personal history of malignant neoplasm of bladder: Secondary | ICD-10-CM | POA: Diagnosis not present

## 2016-04-17 ENCOUNTER — Encounter: Payer: Self-pay | Admitting: Family

## 2016-04-19 ENCOUNTER — Encounter: Payer: Self-pay | Admitting: Family

## 2016-04-23 ENCOUNTER — Ambulatory Visit: Payer: Medicare Other | Admitting: Family

## 2016-04-23 ENCOUNTER — Inpatient Hospital Stay (HOSPITAL_COMMUNITY): Admission: RE | Admit: 2016-04-23 | Payer: Medicare Other | Source: Ambulatory Visit

## 2016-08-09 DIAGNOSIS — N3281 Overactive bladder: Secondary | ICD-10-CM | POA: Diagnosis not present

## 2016-08-09 DIAGNOSIS — E785 Hyperlipidemia, unspecified: Secondary | ICD-10-CM | POA: Diagnosis not present

## 2016-08-09 DIAGNOSIS — Z0000 Encounter for general adult medical examination without abnormal findings: Secondary | ICD-10-CM | POA: Diagnosis not present

## 2016-08-09 DIAGNOSIS — I779 Disorder of arteries and arterioles, unspecified: Secondary | ICD-10-CM | POA: Diagnosis not present

## 2016-08-09 DIAGNOSIS — F411 Generalized anxiety disorder: Secondary | ICD-10-CM | POA: Diagnosis not present

## 2016-08-09 DIAGNOSIS — Z79899 Other long term (current) drug therapy: Secondary | ICD-10-CM | POA: Diagnosis not present

## 2016-08-09 DIAGNOSIS — Z1389 Encounter for screening for other disorder: Secondary | ICD-10-CM | POA: Diagnosis not present

## 2016-08-09 DIAGNOSIS — I10 Essential (primary) hypertension: Secondary | ICD-10-CM | POA: Diagnosis not present

## 2017-02-12 DIAGNOSIS — Z8551 Personal history of malignant neoplasm of bladder: Secondary | ICD-10-CM | POA: Diagnosis not present

## 2017-02-12 DIAGNOSIS — F411 Generalized anxiety disorder: Secondary | ICD-10-CM | POA: Diagnosis not present

## 2017-02-12 DIAGNOSIS — E785 Hyperlipidemia, unspecified: Secondary | ICD-10-CM | POA: Diagnosis not present

## 2017-02-12 DIAGNOSIS — I10 Essential (primary) hypertension: Secondary | ICD-10-CM | POA: Diagnosis not present

## 2017-02-12 DIAGNOSIS — I779 Disorder of arteries and arterioles, unspecified: Secondary | ICD-10-CM | POA: Diagnosis not present

## 2017-02-12 DIAGNOSIS — N3281 Overactive bladder: Secondary | ICD-10-CM | POA: Diagnosis not present

## 2017-02-12 DIAGNOSIS — Z780 Asymptomatic menopausal state: Secondary | ICD-10-CM | POA: Diagnosis not present

## 2017-05-12 ENCOUNTER — Emergency Department (HOSPITAL_COMMUNITY)
Admission: EM | Admit: 2017-05-12 | Discharge: 2017-05-12 | Disposition: A | Discharge status: Home / Self Care | Payer: Medicare Other | Attending: Emergency Medicine | Admitting: Emergency Medicine

## 2017-05-12 ENCOUNTER — Other Ambulatory Visit: Payer: Self-pay

## 2017-05-12 ENCOUNTER — Emergency Department (HOSPITAL_COMMUNITY): Payer: Medicare Other

## 2017-05-12 ENCOUNTER — Encounter (HOSPITAL_COMMUNITY): Payer: Self-pay | Admitting: *Deleted

## 2017-05-12 DIAGNOSIS — M546 Pain in thoracic spine: Secondary | ICD-10-CM | POA: Diagnosis not present

## 2017-05-12 DIAGNOSIS — Z7982 Long term (current) use of aspirin: Secondary | ICD-10-CM | POA: Insufficient documentation

## 2017-05-12 DIAGNOSIS — Z79899 Other long term (current) drug therapy: Secondary | ICD-10-CM | POA: Insufficient documentation

## 2017-05-12 DIAGNOSIS — R0789 Other chest pain: Secondary | ICD-10-CM | POA: Diagnosis not present

## 2017-05-12 DIAGNOSIS — I10 Essential (primary) hypertension: Secondary | ICD-10-CM | POA: Insufficient documentation

## 2017-05-12 DIAGNOSIS — R079 Chest pain, unspecified: Secondary | ICD-10-CM | POA: Diagnosis not present

## 2017-05-12 LAB — CBC
HEMATOCRIT: 42 % (ref 36.0–46.0)
HEMOGLOBIN: 13.7 g/dL (ref 12.0–15.0)
MCH: 29 pg (ref 26.0–34.0)
MCHC: 32.6 g/dL (ref 30.0–36.0)
MCV: 88.8 fL (ref 78.0–100.0)
Platelets: 247 10*3/uL (ref 150–400)
RBC: 4.73 MIL/uL (ref 3.87–5.11)
RDW: 13.9 % (ref 11.5–15.5)
WBC: 10.3 10*3/uL (ref 4.0–10.5)

## 2017-05-12 LAB — BASIC METABOLIC PANEL
ANION GAP: 11 (ref 5–15)
BUN: 16 mg/dL (ref 6–20)
CHLORIDE: 103 mmol/L (ref 101–111)
CO2: 20 mmol/L — ABNORMAL LOW (ref 22–32)
Calcium: 9.1 mg/dL (ref 8.9–10.3)
Creatinine, Ser: 0.98 mg/dL (ref 0.44–1.00)
GFR calc Af Amer: 54 mL/min — ABNORMAL LOW (ref >60)
GFR calc non Af Amer: 47 mL/min — ABNORMAL LOW (ref >60)
GLUCOSE: 117 mg/dL — AB (ref 65–99)
POTASSIUM: 4.2 mmol/L (ref 3.5–5.1)
Sodium: 134 mmol/L — ABNORMAL LOW (ref 135–145)

## 2017-05-12 LAB — I-STAT TROPONIN, ED
TROPONIN I, POC: 0 ng/mL (ref 0.00–0.08)
Troponin i, poc: 0 ng/mL (ref 0.00–0.08)

## 2017-05-12 NOTE — ED Triage Notes (Signed)
Pt states L sided chest pain that radiates to L back since Sat.  Pain is constant and  Not associated with diaphoresis, nausea or sob.

## 2017-05-12 NOTE — ED Provider Notes (Signed)
[name] Provider Note   CSN: [id number] Arrival date & time: 05/12/17  1324     History   Chief Complaint Chief Complaint  Patient presents with  . Chest Pain    HPI Karen [name] is a 83 y.o. female who presents with chest and back pain.  Past medical history significant for arthritis, GERD, hypertension, hyperlipidemia, carotid artery stenosis s/p CEA.  Patient states that for the past 2 days she has had mild left sided chest pain radiating to the left upper back.  She cannot recall when it started describes as a nagging pain and did not pay much attention to it.  Currently she denies any chest pain and states the pain is only in her back.  She describes it as a tingling pain.  It is constant.  Tylenol made it better.  Nothing is made it worse.  No fever, chills, lightheadedness, syncope, shortness of breath, cough, abdominal pain, nausea, vomiting, rash. She lives at an [facility] living facility.   HPI  Past Medical History:  Diagnosis Date  . Anxiety   . Arthritis knee  . Bladder tumor   . Depression   . Dyspnea on exertion   . GERD (gastroesophageal reflux disease)   . Hearing loss BILATERAL AIDS  . Hematuria   . History of endometrial cancer S/P HYSTERECTOMY  2001  . Hyperlipidemia   . Hypertension CARDIOLOGIST-- DR Irish Lack-  LOV  MARCH 2013--  REQUESTED NOTE AND EKG  . Occlusion and stenosis of carotid artery without mention of cerebral infarction FOLLOWED BY DR Kellie Simmering--  LEFT ICA  0=19%  AND RIGHT ICA  20-395   OCCASIONAL DIZZINESS  . PONV (postoperative nausea and vomiting)     Patient Active Problem List   Diagnosis Date Noted  . Carotid stenosis 03/29/2014  . Aftercare following surgery of the circulatory system 03/29/2014  . Aftercare following surgery of the circulatory system, [name] 02/23/2013  . Occlusion and stenosis of carotid artery without mention of cerebral infarction 02/18/2012    Past Surgical History:   Procedure Laterality Date  . CAROTID ENDARTERECTOMY  01-19-2010  DR LAWSON   Left CEA with Dacron patch  . CATARACT EXTRACTION W/ INTRAOCULAR LENS  IMPLANT, BILATERAL    . TRANSTHORACIC ECHOCARDIOGRAM  11-24-2009  DR VARANASI   LVEF  65-70%/  MILD MITRAL, ATRIAL AND TRICUSID REGURG.  . TRANSURETHRAL RESECTION OF BLADDER TUMOR  03/16/2012   Procedure: TRANSURETHRAL RESECTION OF BLADDER TUMOR (TURBT);  Surgeon: Claybon Jabs, MD;  Location: [facility];  Service: Urology;  Laterality: N/A;  1 hour requested for this case  CAMERA GYRUS  . VAGINAL HYSTERECTOMY  2001   W/  BILATERAL SALPINGO-OOPHECTOMY FOR ENDOMETRIAL CANCER    OB History    No data available       Home Medications    Prior to Admission medications   Medication Sig Start Date End Date Taking? Authorizing Provider  AMLODIPINE BESYLATE PO Take 10 mg by mouth every morning.    Yes [provider]  aspirin 325 MG tablet Take 325 mg by mouth daily.   Yes [provider]  aspirin EC 81 MG tablet Take 81 mg by mouth daily.   Yes [provider]  calcium carbonate (TUMS - DOSED IN MG ELEMENTAL CALCIUM) 500 MG chewable tablet Chew 1 tablet by mouth as needed.   Yes [provider]  fish oil-omega-3 fatty acids 1000 MG capsule Take 1,000 mg by mouth daily.  Yes [provider]  Multiple Vitamin (MULTIVITAMIN) tablet Take 1 tablet by mouth daily.   Yes [provider]  pravastatin (PRAVACHOL) 40 MG tablet TAKE 1 TABLET BY MOUTH DAILY 11/02/13  Yes Jettie Booze, MD  HYDROcodone-acetaminophen (NORCO/VICODIN) 5-325 MG per tablet Take 1 tablet by mouth every 6 (six) hours as needed for pain. Patient not taking: Reported on 05/12/2017 03/16/12   Kathie Rhodes, MD  phenazopyridine (PYRIDIUM) 200 MG tablet Take 1 tablet (200 mg total) by mouth 3 (three) times daily as needed for pain. Patient not taking: Reported on 04/18/2015 03/16/12   Kathie Rhodes, MD     Family History Family History  Problem Relation Age of Onset  . Coronary artery disease Father   . Hypertension Father   . Hypertension Mother   . Cancer Mother        Colon    Social History Social History   Tobacco Use  . Smoking status: Never Smoker  . Smokeless tobacco: Never Used  Substance Use Topics  . Alcohol use: No  . Drug use: No     Allergies   Lisinopril and Celebrex [celecoxib]   Review of Systems Review of Systems  Constitutional: Negative for chills and fever.  Respiratory: Negative for cough and shortness of breath.   Cardiovascular: Positive for chest pain (Resolved).  Gastrointestinal: Negative for abdominal pain, nausea and vomiting.  Genitourinary: Negative for dysuria and flank pain.  Musculoskeletal: Positive for back pain.  Skin: Positive for rash.     Physical Exam Updated Vital Signs BP (!) 148/65 (BP Location: Right Arm)   Pulse 80   Temp 98.2 F (36.8 C) (Oral)   Resp 14   Ht 5\' 3"  (1.6 m)   Wt 61.2 kg (135 lb)   SpO2 98%   BMI 23.91 kg/m   Physical Exam  Constitutional: She is oriented to person, place, and time. She appears well-developed and well-nourished. No distress.  HENT:  Head: Normocephalic and atraumatic.  Eyes: Conjunctivae are normal. Pupils are equal, round, and reactive to light. Right eye exhibits no discharge. Left eye exhibits no discharge. No scleral icterus.  Neck: Normal range of motion.  Cardiovascular: Normal rate and regular rhythm. Exam reveals no gallop and no friction rub.  No murmur heard. Pulmonary/Chest: Effort normal and breath sounds normal. No stridor. No respiratory distress. She has no wheezes. She has no rales. She exhibits no tenderness.  Abdominal: Soft. Bowel sounds are normal. She exhibits no distension. There is no tenderness.  Musculoskeletal:  Left thoracic paraspinal muscle tenderness.  No rash.  Neurological: She is alert and oriented to person, place, and time.  Skin: Skin  is warm and dry.  Psychiatric: She has a normal mood and affect. Her behavior is normal.  Nursing note and vitals reviewed.    ED Treatments / Results  Labs (all labs ordered are listed, but only abnormal results are displayed) Labs Reviewed  BASIC METABOLIC PANEL - Abnormal; Notable for the following components:      Result Value   Sodium 134 (*)    CO2 20 (*)    Glucose, Bld 117 (*)    GFR calc non Af Amer 47 (*)    GFR calc Af Amer 54 (*)    All other components within normal limits  CBC  I-STAT TROPONIN, ED  I-STAT TROPONIN, ED    EKG  EKG Interpretation  Date/Time:  Monday May 12 2017 09:05:57 EST Ventricular Rate:  86 PR Interval:  172  QRS Duration: 84 QT Interval:  386 QTC Calculation: 461 R Axis:   16 Text Interpretation:  Poor data quality, interpretation may be adversely affected Sinus rhythm with Premature atrial complexes Septal infarct , age undetermined Abnormal ECG No significant change since last tracing Confirmed by Deno Etienne [phone]) on 05/12/2017 11:35:45 AM       Radiology Dg Chest 2 View  Result Date: 05/12/2017 CLINICAL DATA:  Posterior left left chest pain. History of peripheral vascular disease, unspecified bladder tumor, endometrial malignancy, never smoked. EXAM: CHEST  2 VIEW COMPARISON:  Chest x-ray of January 16, 2010 FINDINGS: The lungs are adequately inflated and clear. The heart and pulmonary vascularity are normal. The mediastinum is normal in width. There is stable curvature of the midthoracic spine convex toward the left. IMPRESSION: There is no acute cardiopulmonary abnormality. Electronically Signed   By: David  Martinique M.D.   On: 05/12/2017 09:42    Procedures Procedures (including critical care time)  Medications Ordered in ED Medications - No data to display   Initial Impression / Assessment and Plan / ED Course  I have reviewed the triage vital signs and the nursing notes.  Pertinent labs & imaging results that were  available during my care of the patient were reviewed by me and considered in my medical decision making (see chart for details).  83 year old with chest pain. Chest pain is atypical in nature. She is actually having back pain at this time and not chest pain. Chest pain work up is reassuring. Doubt ACS, PE, pericarditis, esophageal rupture, tension pneumothorax, aortic dissection, cardiac tamponade. EKG is SR with PACs. CXR is negative. Initial and second troponin is 0. Labs are remarkable for mild hyponatremia. It's a possibility this is early shingles. There is no rash at this time and she has tenderness of the thoracic paraspinal muscles. Will treat as MSK pain and gave return precautions for worsening pain/rash, etc.    Final Clinical Impressions(s) / ED Diagnoses   Final diagnoses:  Atypical chest pain  Acute left-sided thoracic back pain    ED Discharge Orders    None       Recardo Evangelist, PA-C 05/12/17 [place], DO 05/12/17 1406

## 2017-05-12 NOTE — Discharge Instructions (Signed)
Take Tylenol as needed for pain. Use a heating pad for stiff/sore muscles If you develop a rash or worsening pain, please follow up with your doctor or return to the Emergency Dept.

## 2017-06-05 DIAGNOSIS — S39012A Strain of muscle, fascia and tendon of lower back, initial encounter: Secondary | ICD-10-CM | POA: Diagnosis not present

## 2017-06-05 DIAGNOSIS — R300 Dysuria: Secondary | ICD-10-CM | POA: Diagnosis not present

## 2017-06-12 ENCOUNTER — Ambulatory Visit
Admission: RE | Admit: 2017-06-12 | Discharge: 2017-06-12 | Disposition: A | Discharge status: Home / Self Care | Payer: Medicare Other | Source: Ambulatory Visit | Attending: Nurse Practitioner | Admitting: Nurse Practitioner

## 2017-06-12 ENCOUNTER — Other Ambulatory Visit: Payer: Self-pay | Admitting: Nurse Practitioner

## 2017-06-12 ENCOUNTER — Other Ambulatory Visit: Payer: Medicare Other

## 2017-06-12 DIAGNOSIS — M545 Low back pain: Secondary | ICD-10-CM

## 2017-06-12 DIAGNOSIS — G8929 Other chronic pain: Secondary | ICD-10-CM | POA: Diagnosis not present

## 2017-06-12 DIAGNOSIS — M160 Bilateral primary osteoarthritis of hip: Secondary | ICD-10-CM | POA: Diagnosis not present

## 2017-06-12 DIAGNOSIS — S32010A Wedge compression fracture of first lumbar vertebra, initial encounter for closed fracture: Secondary | ICD-10-CM | POA: Diagnosis not present

## 2017-06-16 ENCOUNTER — Ambulatory Visit
Admission: RE | Admit: 2017-06-16 | Discharge: 2017-06-16 | Disposition: A | Discharge status: Home / Self Care | Payer: Medicare Other | Source: Ambulatory Visit | Attending: Nurse Practitioner | Admitting: Nurse Practitioner

## 2017-06-16 ENCOUNTER — Other Ambulatory Visit: Payer: Self-pay | Admitting: Nurse Practitioner

## 2017-06-16 DIAGNOSIS — M48061 Spinal stenosis, lumbar region without neurogenic claudication: Secondary | ICD-10-CM | POA: Diagnosis not present

## 2017-06-16 DIAGNOSIS — S32010A Wedge compression fracture of first lumbar vertebra, initial encounter for closed fracture: Secondary | ICD-10-CM

## 2017-06-17 ENCOUNTER — Other Ambulatory Visit: Payer: Self-pay | Admitting: Internal Medicine

## 2017-06-19 ENCOUNTER — Encounter (HOSPITAL_COMMUNITY): Payer: Self-pay

## 2017-06-19 ENCOUNTER — Emergency Department (HOSPITAL_COMMUNITY)
Admission: EM | Admit: 2017-06-19 | Discharge: 2017-06-19 | Disposition: A | Discharge status: Home / Self Care | Payer: Medicare Other | Attending: Emergency Medicine | Admitting: Emergency Medicine

## 2017-06-19 DIAGNOSIS — X58XXXA Exposure to other specified factors, initial encounter: Secondary | ICD-10-CM | POA: Diagnosis not present

## 2017-06-19 DIAGNOSIS — Y929 Unspecified place or not applicable: Secondary | ICD-10-CM | POA: Insufficient documentation

## 2017-06-19 DIAGNOSIS — I10 Essential (primary) hypertension: Secondary | ICD-10-CM | POA: Diagnosis not present

## 2017-06-19 DIAGNOSIS — S3992XA Unspecified injury of lower back, initial encounter: Secondary | ICD-10-CM | POA: Diagnosis present

## 2017-06-19 DIAGNOSIS — Y939 Activity, unspecified: Secondary | ICD-10-CM | POA: Insufficient documentation

## 2017-06-19 DIAGNOSIS — Y998 Other external cause status: Secondary | ICD-10-CM | POA: Insufficient documentation

## 2017-06-19 DIAGNOSIS — Z79899 Other long term (current) drug therapy: Secondary | ICD-10-CM | POA: Insufficient documentation

## 2017-06-19 DIAGNOSIS — Z7982 Long term (current) use of aspirin: Secondary | ICD-10-CM | POA: Insufficient documentation

## 2017-06-19 DIAGNOSIS — S32010A Wedge compression fracture of first lumbar vertebra, initial encounter for closed fracture: Secondary | ICD-10-CM

## 2017-06-19 DIAGNOSIS — Z7902 Long term (current) use of antithrombotics/antiplatelets: Secondary | ICD-10-CM | POA: Diagnosis not present

## 2017-06-19 MED ORDER — LIDOCAINE 5 % EX PTCH: 1.0000 | MEDICATED_PATCH | CUTANEOUS | 0 refills | Status: DC

## 2017-06-19 NOTE — ED Notes (Signed)
Bed: [facility] Expected date:  Expected time:  Means of arrival:  Comments: 

## 2017-06-19 NOTE — ED Provider Notes (Signed)
[facility] DEPT Provider Note   CSN: [id number] Arrival date & time: 06/19/17  1235     History   Chief Complaint Chief Complaint  Patient presents with  . Back Pain    HPI Karen [name] is a 84 y.o. female.  HPI  84 year old female presents with back pain.  She has been having back pain for about 2 weeks.  On 06/16/17, she had an MRI that showed a subacute compression fracture of L1 that has about 50% height loss.  Also severe neural foraminal stenosis.  The patient has been on Tylenol and tramadol.  The Tylenol is not very effective in the tramadol makes her too dizzy and sleepy.  Thus her family does not want her to take it during the day and patient states it makes her too dizzy.  She does not want something stronger but is concerned because the pain is worsening.  She has no weakness or numbness in her extremities.  Sometimes the pain radiates down her thighs.  No incontinence.  She found out when she arrived here that she has an orthopedic follow-up with Dr. Tonita Cong tomorrow  Past Medical History:  Diagnosis Date  . Anxiety   . Arthritis knee  . Bladder tumor   . Depression   . Dyspnea on exertion   . GERD (gastroesophageal reflux disease)   . Hearing loss BILATERAL AIDS  . Hematuria   . History of endometrial cancer S/P HYSTERECTOMY  2001  . Hyperlipidemia   . Hypertension CARDIOLOGIST-- DR Irish Lack-  LOV  MARCH 2013--  REQUESTED NOTE AND EKG  . Occlusion and stenosis of carotid artery without mention of cerebral infarction FOLLOWED BY DR Kellie Simmering--  LEFT ICA  0=19%  AND RIGHT ICA  20-395   OCCASIONAL DIZZINESS  . PONV (postoperative nausea and vomiting)     Patient Active Problem List   Diagnosis Date Noted  . Carotid stenosis 03/29/2014  . Aftercare following surgery of the circulatory system 03/29/2014  . Aftercare following surgery of the circulatory system, [place] 02/23/2013  . Occlusion and stenosis of carotid artery without mention of  cerebral infarction 02/18/2012    Past Surgical History:  Procedure Laterality Date  . CAROTID ENDARTERECTOMY  01-19-2010  DR LAWSON   Left CEA with Dacron patch  . CATARACT EXTRACTION W/ INTRAOCULAR LENS  IMPLANT, BILATERAL    . TRANSTHORACIC ECHOCARDIOGRAM  11-24-2009  DR VARANASI   LVEF  65-70%/  MILD MITRAL, ATRIAL AND TRICUSID REGURG.  . TRANSURETHRAL RESECTION OF BLADDER TUMOR  03/16/2012   Procedure: TRANSURETHRAL RESECTION OF BLADDER TUMOR (TURBT);  Surgeon: Claybon Jabs, MD;  Location: [facility];  Service: Urology;  Laterality: N/A;  1 hour requested for this case  CAMERA GYRUS  . VAGINAL HYSTERECTOMY  2001   W/  BILATERAL SALPINGO-OOPHECTOMY FOR ENDOMETRIAL CANCER    OB History    No data available       Home Medications    Prior to Admission medications   Medication Sig Start Date End Date Taking? Authorizing Provider  amLODipine (NORVASC) 10 MG tablet Take 10 mg by mouth once. 03/29/17  Yes [provider]  aspirin EC 81 MG tablet Take 81 mg by mouth daily.   Yes [provider]  calcium carbonate (TUMS - DOSED IN MG ELEMENTAL CALCIUM) 500 MG chewable tablet Chew 1 tablet by mouth as needed.   Yes [provider]  fish oil-omega-3 fatty acids 1000 MG capsule Take 2 g by mouth  2 (two) times daily before a meal.    Yes [provider]  Multiple Vitamin (MULTIVITAMIN) tablet Take 1 tablet by mouth daily.   Yes [provider]  pravastatin (PRAVACHOL) 40 MG tablet TAKE 1 TABLET BY MOUTH DAILY 11/02/13  Yes Jettie Booze, MD  traMADol-acetaminophen (ULTRACET) 37.5-325 MG tablet Take 1 tablet by mouth every 6 (six) hours as needed for pain. 06/16/17  Yes [provider]  aspirin 325 MG tablet Take 325 mg by mouth as needed for mild pain.     [provider]  lidocaine (LIDODERM) 5 % Place 1 patch onto the skin daily. Remove & Discard patch within 12 hours or as directed by MD 06/19/17    Sherwood Gambler, MD    Family History Family History  Problem Relation Age of Onset  . Coronary artery disease Father   . Hypertension Father   . Hypertension Mother   . Cancer Mother        Colon    Social History Social History   Tobacco Use  . Smoking status: Never Smoker  . Smokeless tobacco: Never Used  Substance Use Topics  . Alcohol use: No  . Drug use: No     Allergies   Lisinopril and Celebrex [celecoxib]   Review of Systems Review of Systems  Constitutional: Negative for fever.  Musculoskeletal: Positive for back pain.  Neurological: Negative for weakness and numbness.  All other systems reviewed and are negative.    Physical Exam Updated Vital Signs BP (!) 151/54 (BP Location: Left Arm)   Pulse 77   Temp 98.2 F (36.8 C) (Oral)   Resp 17   Ht 5\' 4"  (1.626 m)   Wt 60.8 kg (134 lb)   SpO2 96%   BMI 23.00 kg/m   Physical Exam  Constitutional: She is oriented to person, place, and time. She appears well-developed and well-nourished. No distress.  HENT:  Head: Normocephalic and atraumatic.  Right Ear: External ear normal.  Left Ear: External ear normal.  Nose: Nose normal.  Eyes: Right eye exhibits no discharge. Left eye exhibits no discharge.  Pulmonary/Chest: Effort normal.  Abdominal: She exhibits no distension. There is no tenderness.  Musculoskeletal:       Lumbar back: She exhibits tenderness (mild).  Neurological: She is alert and oriented to person, place, and time.  5/5 strength in BLE. Normal gross sensation  Skin: Skin is warm and dry. She is not diaphoretic.  Nursing note and vitals reviewed.    ED Treatments / Results  Labs (all labs ordered are listed, but only abnormal results are displayed) Labs Reviewed - No data to display  EKG  EKG Interpretation None       Radiology No results found.  Procedures Procedures (including critical care time)  Medications Ordered in ED Medications - No data to  display   Initial Impression / Assessment and Plan / ED Course  I have reviewed the triage vital signs and the nursing notes.  Pertinent labs & imaging results that were available during my care of the patient were reviewed by me and considered in my medical decision making (see chart for details).     We discussed the patient's options for pain.  She does not want anything stronger such as hydrocodone or oxycodone.  We discussed that we can try Lidoderm patch and see if this helps.  We discussed not using it more than prescribed.  She is interested in trying this in addition to the Tylenol  and tramadol at night.  She will follow-up with orthopedics tomorrow.  She has no acute neurologic dysfunction. D/c with return precautions.  Final Clinical Impressions(s) / ED Diagnoses   Final diagnoses:  Closed compression fracture of first lumbar vertebra, initial encounter [facility])    ED Discharge Orders        Ordered    lidocaine (LIDODERM) 5 %  Every 24 hours     06/19/17 1536       Sherwood Gambler, MD 06/19/17 1559

## 2017-06-19 NOTE — ED Triage Notes (Signed)
Pt reports that she was recently dx'd with a compression fracture in her back. She has been placed on some tramadol, but it makes her feel "loopy" and her family is afraid that she might fall. She lives alone and usually gets around with a walker. She has an appointment with Orthopedics tomorrow, but states that the pain is too bad to wait that long. She denies urinary symptoms, but was dx'd with a UTI after Christmas, for which she was treated with antibiotics. A&Ox4.

## 2017-06-20 DIAGNOSIS — M545 Low back pain: Secondary | ICD-10-CM | POA: Diagnosis not present

## 2017-06-20 DIAGNOSIS — M4856XA Collapsed vertebra, not elsewhere classified, lumbar region, initial encounter for fracture: Secondary | ICD-10-CM | POA: Diagnosis not present

## 2017-06-21 ENCOUNTER — Other Ambulatory Visit: Payer: Self-pay | Admitting: Nurse Practitioner

## 2017-06-21 DIAGNOSIS — M545 Low back pain: Secondary | ICD-10-CM

## 2017-07-07 DIAGNOSIS — M4856XA Collapsed vertebra, not elsewhere classified, lumbar region, initial encounter for fracture: Secondary | ICD-10-CM | POA: Diagnosis not present

## 2017-08-04 DIAGNOSIS — M4856XA Collapsed vertebra, not elsewhere classified, lumbar region, initial encounter for fracture: Secondary | ICD-10-CM | POA: Diagnosis not present

## 2017-09-08 DIAGNOSIS — N3281 Overactive bladder: Secondary | ICD-10-CM | POA: Diagnosis not present

## 2017-09-08 DIAGNOSIS — F411 Generalized anxiety disorder: Secondary | ICD-10-CM | POA: Diagnosis not present

## 2017-09-08 DIAGNOSIS — E785 Hyperlipidemia, unspecified: Secondary | ICD-10-CM | POA: Diagnosis not present

## 2017-09-08 DIAGNOSIS — Z1389 Encounter for screening for other disorder: Secondary | ICD-10-CM | POA: Diagnosis not present

## 2017-09-08 DIAGNOSIS — R300 Dysuria: Secondary | ICD-10-CM | POA: Diagnosis not present

## 2017-09-08 DIAGNOSIS — Z0000 Encounter for general adult medical examination without abnormal findings: Secondary | ICD-10-CM | POA: Diagnosis not present

## 2017-09-08 DIAGNOSIS — S22080S Wedge compression fracture of T11-T12 vertebra, sequela: Secondary | ICD-10-CM | POA: Diagnosis not present

## 2017-09-08 DIAGNOSIS — I10 Essential (primary) hypertension: Secondary | ICD-10-CM | POA: Diagnosis not present

## 2018-06-04 IMAGING — CR DG HIP (WITH OR WITHOUT PELVIS) 3-4V BILAT
3 series · 3 of 3 positions shown · non-contrast
Comparison: None.

CLINICAL DATA: Bilateral low back pain with NKI, with sciatica.

EXAM:
DG HIP (WITH OR WITHOUT PELVIS) 3-4V BILAT

[t pelvis a.p.]
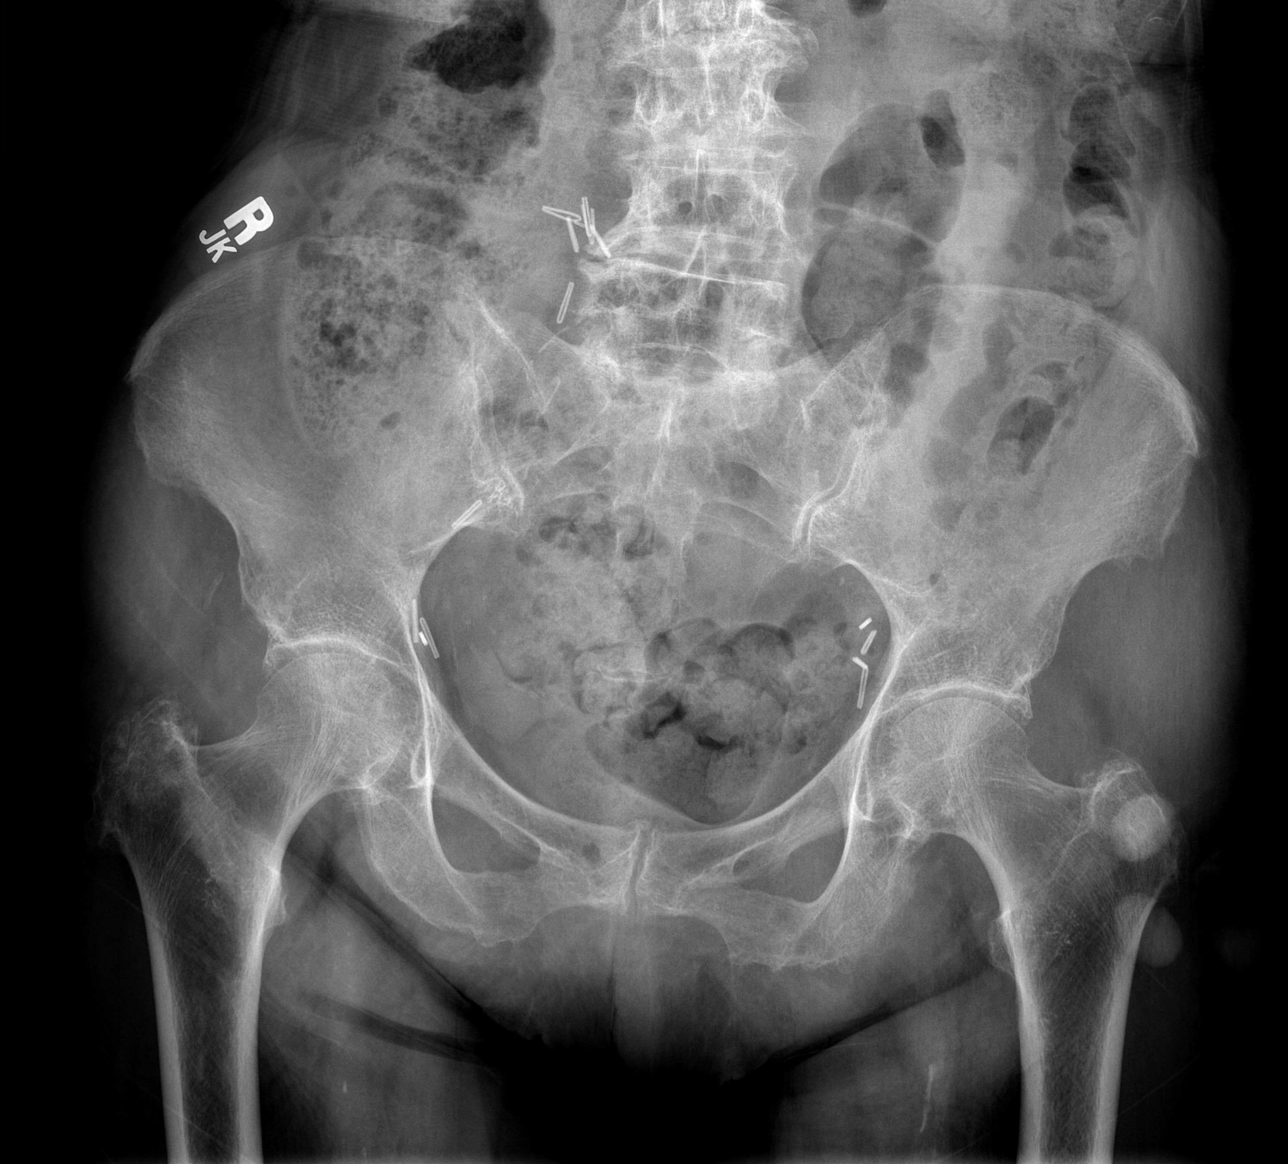

[t hip ap left]
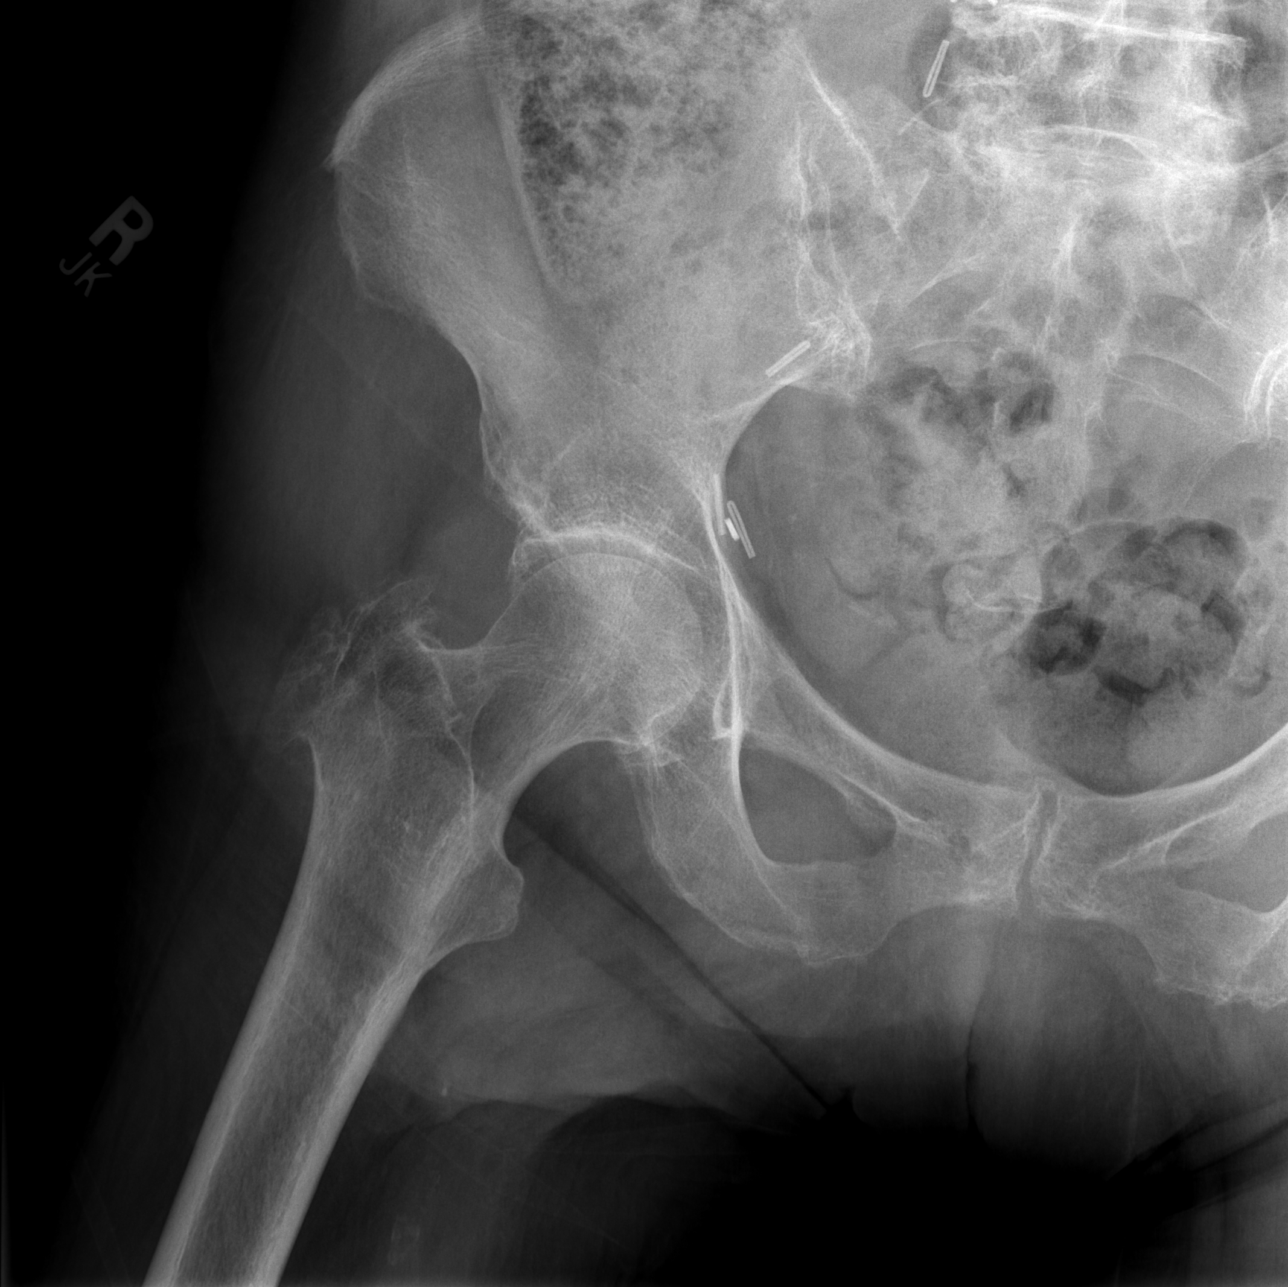

[t hip frog leg left]
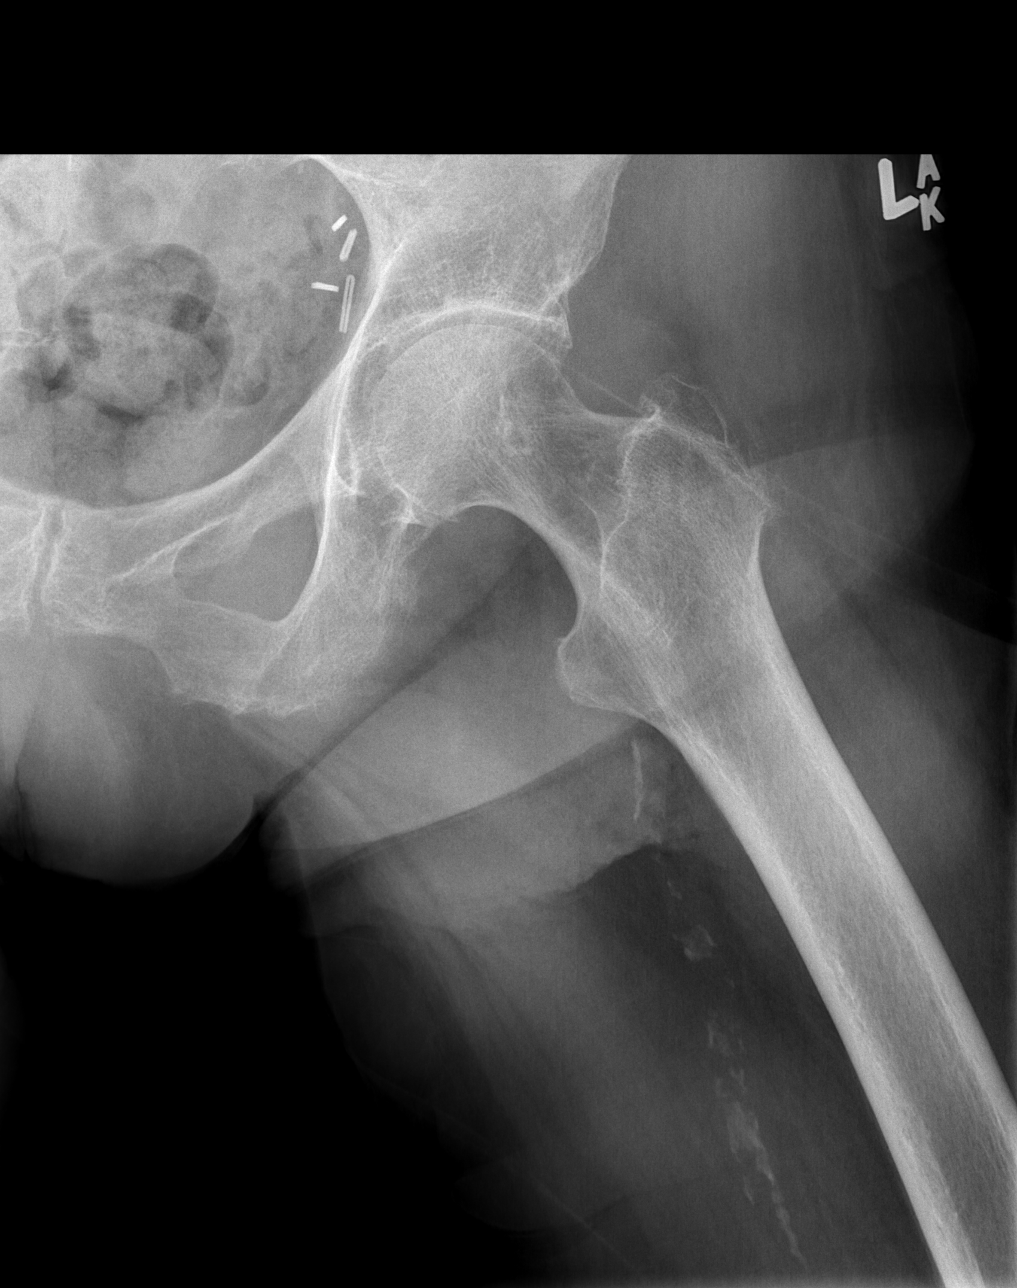

[3 of 3 positions shown; findings below may reference images not displayed]

FINDINGS: Osseous structures of the pelvis and about the bilateral hips appear
intact and normally aligned. No fracture line or displaced fracture
fragment identified. Soft tissues about the pelvis and left hip are
unremarkable.

Degenerative changes at the bilateral hips, mild to moderate in
degree. Additional degenerative changes within the scoliotic lower
lumbar spine, at least moderate in degree.
IMPRESSION: 1. No acute findings.
2. Degenerative changes at both hips, mild to moderate in degree.
3. Degenerative changes within the lower lumbar spine, at least
moderate in degree.

## 2019-01-15 DIAGNOSIS — N3281 Overactive bladder: Secondary | ICD-10-CM | POA: Diagnosis not present

## 2019-01-15 DIAGNOSIS — S22080S Wedge compression fracture of T11-T12 vertebra, sequela: Secondary | ICD-10-CM | POA: Diagnosis not present

## 2019-01-15 DIAGNOSIS — E785 Hyperlipidemia, unspecified: Secondary | ICD-10-CM | POA: Diagnosis not present

## 2019-01-15 DIAGNOSIS — Z0000 Encounter for general adult medical examination without abnormal findings: Secondary | ICD-10-CM | POA: Diagnosis not present

## 2019-01-15 DIAGNOSIS — Z1389 Encounter for screening for other disorder: Secondary | ICD-10-CM | POA: Diagnosis not present

## 2019-01-15 DIAGNOSIS — F411 Generalized anxiety disorder: Secondary | ICD-10-CM | POA: Diagnosis not present

## 2019-01-15 DIAGNOSIS — I10 Essential (primary) hypertension: Secondary | ICD-10-CM | POA: Diagnosis not present

## 2019-01-15 DIAGNOSIS — Z13220 Encounter for screening for lipoid disorders: Secondary | ICD-10-CM | POA: Diagnosis not present

## 2019-11-23 DIAGNOSIS — I779 Disorder of arteries and arterioles, unspecified: Secondary | ICD-10-CM | POA: Diagnosis not present

## 2019-11-23 DIAGNOSIS — N1831 Chronic kidney disease, stage 3a: Secondary | ICD-10-CM | POA: Diagnosis not present

## 2019-11-23 DIAGNOSIS — I10 Essential (primary) hypertension: Secondary | ICD-10-CM | POA: Diagnosis not present

## 2019-11-23 DIAGNOSIS — E785 Hyperlipidemia, unspecified: Secondary | ICD-10-CM | POA: Diagnosis not present

## 2020-01-18 DIAGNOSIS — Z0000 Encounter for general adult medical examination without abnormal findings: Secondary | ICD-10-CM | POA: Diagnosis not present

## 2020-01-18 DIAGNOSIS — I10 Essential (primary) hypertension: Secondary | ICD-10-CM | POA: Diagnosis not present

## 2020-01-18 DIAGNOSIS — F411 Generalized anxiety disorder: Secondary | ICD-10-CM | POA: Diagnosis not present

## 2020-01-18 DIAGNOSIS — Z7189 Other specified counseling: Secondary | ICD-10-CM | POA: Diagnosis not present

## 2020-01-18 DIAGNOSIS — I779 Disorder of arteries and arterioles, unspecified: Secondary | ICD-10-CM | POA: Diagnosis not present

## 2020-01-18 DIAGNOSIS — N1831 Chronic kidney disease, stage 3a: Secondary | ICD-10-CM | POA: Diagnosis not present

## 2020-01-18 DIAGNOSIS — Z8551 Personal history of malignant neoplasm of bladder: Secondary | ICD-10-CM | POA: Diagnosis not present

## 2020-01-18 DIAGNOSIS — Z1389 Encounter for screening for other disorder: Secondary | ICD-10-CM | POA: Diagnosis not present

## 2020-01-18 DIAGNOSIS — E785 Hyperlipidemia, unspecified: Secondary | ICD-10-CM | POA: Diagnosis not present

## 2020-07-18 DIAGNOSIS — I872 Venous insufficiency (chronic) (peripheral): Secondary | ICD-10-CM | POA: Diagnosis not present

## 2020-07-18 DIAGNOSIS — E785 Hyperlipidemia, unspecified: Secondary | ICD-10-CM | POA: Diagnosis not present

## 2020-07-18 DIAGNOSIS — N1831 Chronic kidney disease, stage 3a: Secondary | ICD-10-CM | POA: Diagnosis not present

## 2020-07-18 DIAGNOSIS — I10 Essential (primary) hypertension: Secondary | ICD-10-CM | POA: Diagnosis not present

## 2020-12-31 ENCOUNTER — Other Ambulatory Visit: Payer: Self-pay

## 2020-12-31 ENCOUNTER — Emergency Department (HOSPITAL_COMMUNITY): Payer: Medicare Other

## 2020-12-31 ENCOUNTER — Encounter (HOSPITAL_COMMUNITY): Payer: Self-pay | Admitting: Student

## 2020-12-31 ENCOUNTER — Inpatient Hospital Stay (HOSPITAL_COMMUNITY)
Admission: EM | Admit: 2020-12-31 | Discharge: 2021-01-03 | DRG: 683 | Disposition: A | Discharge status: Skilled Nursing Facility | Payer: Medicare Other | Attending: Internal Medicine | Admitting: Internal Medicine

## 2020-12-31 ENCOUNTER — Observation Stay (HOSPITAL_COMMUNITY): Payer: Medicare Other

## 2020-12-31 DIAGNOSIS — R339 Retention of urine, unspecified: Secondary | ICD-10-CM | POA: Diagnosis present

## 2020-12-31 DIAGNOSIS — Z20822 Contact with and (suspected) exposure to covid-19: Secondary | ICD-10-CM | POA: Diagnosis not present

## 2020-12-31 DIAGNOSIS — R000 Tachycardia, unspecified: Secondary | ICD-10-CM | POA: Diagnosis present

## 2020-12-31 DIAGNOSIS — E785 Hyperlipidemia, unspecified: Secondary | ICD-10-CM | POA: Diagnosis present

## 2020-12-31 DIAGNOSIS — Z7982 Long term (current) use of aspirin: Secondary | ICD-10-CM

## 2020-12-31 DIAGNOSIS — D72829 Elevated white blood cell count, unspecified: Secondary | ICD-10-CM | POA: Diagnosis not present

## 2020-12-31 DIAGNOSIS — E861 Hypovolemia: Secondary | ICD-10-CM | POA: Diagnosis present

## 2020-12-31 DIAGNOSIS — H9193 Unspecified hearing loss, bilateral: Secondary | ICD-10-CM | POA: Diagnosis present

## 2020-12-31 DIAGNOSIS — E872 Acidosis, unspecified: Secondary | ICD-10-CM | POA: Diagnosis present

## 2020-12-31 DIAGNOSIS — R748 Abnormal levels of other serum enzymes: Secondary | ICD-10-CM | POA: Diagnosis not present

## 2020-12-31 DIAGNOSIS — Z8542 Personal history of malignant neoplasm of other parts of uterus: Secondary | ICD-10-CM

## 2020-12-31 DIAGNOSIS — R112 Nausea with vomiting, unspecified: Secondary | ICD-10-CM | POA: Diagnosis not present

## 2020-12-31 DIAGNOSIS — Z888 Allergy status to other drugs, medicaments and biological substances status: Secondary | ICD-10-CM

## 2020-12-31 DIAGNOSIS — K7689 Other specified diseases of liver: Secondary | ICD-10-CM | POA: Diagnosis not present

## 2020-12-31 DIAGNOSIS — R778 Other specified abnormalities of plasma proteins: Secondary | ICD-10-CM | POA: Diagnosis not present

## 2020-12-31 DIAGNOSIS — E860 Dehydration: Secondary | ICD-10-CM | POA: Diagnosis not present

## 2020-12-31 DIAGNOSIS — I491 Atrial premature depolarization: Secondary | ICD-10-CM | POA: Diagnosis not present

## 2020-12-31 DIAGNOSIS — E871 Hypo-osmolality and hyponatremia: Secondary | ICD-10-CM | POA: Diagnosis not present

## 2020-12-31 DIAGNOSIS — Z79899 Other long term (current) drug therapy: Secondary | ICD-10-CM

## 2020-12-31 DIAGNOSIS — N179 Acute kidney failure, unspecified: Principal | ICD-10-CM | POA: Diagnosis present

## 2020-12-31 DIAGNOSIS — R110 Nausea: Secondary | ICD-10-CM | POA: Diagnosis not present

## 2020-12-31 DIAGNOSIS — K8689 Other specified diseases of pancreas: Secondary | ICD-10-CM | POA: Diagnosis not present

## 2020-12-31 DIAGNOSIS — F419 Anxiety disorder, unspecified: Secondary | ICD-10-CM | POA: Diagnosis present

## 2020-12-31 DIAGNOSIS — Z886 Allergy status to analgesic agent status: Secondary | ICD-10-CM

## 2020-12-31 DIAGNOSIS — I493 Ventricular premature depolarization: Secondary | ICD-10-CM | POA: Diagnosis present

## 2020-12-31 DIAGNOSIS — K5730 Diverticulosis of large intestine without perforation or abscess without bleeding: Secondary | ICD-10-CM | POA: Diagnosis not present

## 2020-12-31 DIAGNOSIS — N1831 Chronic kidney disease, stage 3a: Secondary | ICD-10-CM | POA: Diagnosis not present

## 2020-12-31 DIAGNOSIS — I10 Essential (primary) hypertension: Secondary | ICD-10-CM | POA: Diagnosis not present

## 2020-12-31 DIAGNOSIS — K219 Gastro-esophageal reflux disease without esophagitis: Secondary | ICD-10-CM | POA: Diagnosis present

## 2020-12-31 DIAGNOSIS — Z8551 Personal history of malignant neoplasm of bladder: Secondary | ICD-10-CM

## 2020-12-31 DIAGNOSIS — Z23 Encounter for immunization: Secondary | ICD-10-CM

## 2020-12-31 DIAGNOSIS — N261 Atrophy of kidney (terminal): Secondary | ICD-10-CM | POA: Diagnosis present

## 2020-12-31 DIAGNOSIS — Z8249 Family history of ischemic heart disease and other diseases of the circulatory system: Secondary | ICD-10-CM

## 2020-12-31 DIAGNOSIS — Z66 Do not resuscitate: Secondary | ICD-10-CM | POA: Diagnosis present

## 2020-12-31 DIAGNOSIS — R531 Weakness: Secondary | ICD-10-CM | POA: Diagnosis not present

## 2020-12-31 DIAGNOSIS — G9389 Other specified disorders of brain: Secondary | ICD-10-CM | POA: Diagnosis not present

## 2020-12-31 DIAGNOSIS — F32A Depression, unspecified: Secondary | ICD-10-CM | POA: Diagnosis present

## 2020-12-31 DIAGNOSIS — I129 Hypertensive chronic kidney disease with stage 1 through stage 4 chronic kidney disease, or unspecified chronic kidney disease: Secondary | ICD-10-CM | POA: Diagnosis present

## 2020-12-31 DIAGNOSIS — R059 Cough, unspecified: Secondary | ICD-10-CM | POA: Diagnosis present

## 2020-12-31 LAB — CBC WITH DIFFERENTIAL/PLATELET
Abs Immature Granulocytes: 0.2 10*3/uL — ABNORMAL HIGH (ref 0.00–0.07)
Basophils Absolute: 0 10*3/uL (ref 0.0–0.1)
Basophils Relative: 0 %
Eosinophils Absolute: 0 10*3/uL (ref 0.0–0.5)
Eosinophils Relative: 0 %
HCT: 40.6 % (ref 36.0–46.0)
Hemoglobin: 12.8 g/dL (ref 12.0–15.0)
Immature Granulocytes: 1 %
Lymphocytes Relative: 7 %
Lymphs Abs: 1.4 10*3/uL (ref 0.7–4.0)
MCH: 28.2 pg (ref 26.0–34.0)
MCHC: 31.5 g/dL (ref 30.0–36.0)
MCV: 89.4 fL (ref 80.0–100.0)
Monocytes Absolute: 1 10*3/uL (ref 0.1–1.0)
Monocytes Relative: 5 %
Neutro Abs: 18.2 10*3/uL — ABNORMAL HIGH (ref 1.7–7.7)
Neutrophils Relative %: 87 %
Platelets: 308 10*3/uL (ref 150–400)
RBC: 4.54 MIL/uL (ref 3.87–5.11)
RDW: 17.2 % — ABNORMAL HIGH (ref 11.5–15.5)
WBC: 20.9 10*3/uL — ABNORMAL HIGH (ref 4.0–10.5)
nRBC: 0 % (ref 0.0–0.2)

## 2020-12-31 LAB — COMPREHENSIVE METABOLIC PANEL
ALT: 21 U/L (ref 0–44)
AST: 37 U/L (ref 15–41)
Albumin: 4 g/dL (ref 3.5–5.0)
Alkaline Phosphatase: 77 U/L (ref 38–126)
Anion gap: 15 (ref 5–15)
BUN: 36 mg/dL — ABNORMAL HIGH (ref 8–23)
CO2: 18 mmol/L — ABNORMAL LOW (ref 22–32)
Calcium: 9.4 mg/dL (ref 8.9–10.3)
Chloride: 101 mmol/L (ref 98–111)
Creatinine, Ser: 2.6 mg/dL — ABNORMAL HIGH (ref 0.44–1.00)
GFR, Estimated: 16 mL/min — ABNORMAL LOW (ref >60)
Glucose, Bld: 126 mg/dL — ABNORMAL HIGH (ref 70–99)
Potassium: 4.1 mmol/L (ref 3.5–5.1)
Sodium: 134 mmol/L — ABNORMAL LOW (ref 135–145)
Total Bilirubin: 0.9 mg/dL (ref 0.3–1.2)
Total Protein: 7.5 g/dL (ref 6.5–8.1)

## 2020-12-31 LAB — URINALYSIS, ROUTINE W REFLEX MICROSCOPIC
Bilirubin Urine: NEGATIVE
Glucose, UA: NEGATIVE mg/dL
Hgb urine dipstick: NEGATIVE
Ketones, ur: NEGATIVE mg/dL
Leukocytes,Ua: NEGATIVE
Nitrite: NEGATIVE
Protein, ur: NEGATIVE mg/dL
Specific Gravity, Urine: 1.013 (ref 1.005–1.030)
pH: 5 (ref 5.0–8.0)

## 2020-12-31 LAB — TROPONIN I (HIGH SENSITIVITY)
Troponin I (High Sensitivity): 43 ng/L — ABNORMAL HIGH (ref <18)
Troponin I (High Sensitivity): 49 ng/L — ABNORMAL HIGH (ref <18)

## 2020-12-31 LAB — LIPASE, BLOOD: Lipase: 205 U/L — ABNORMAL HIGH (ref 11–51)

## 2020-12-31 LAB — CK: Total CK: 357 U/L — ABNORMAL HIGH (ref 38–234)

## 2020-12-31 MED ORDER — ONDANSETRON HCL 4 MG PO TABS: 4.0000 mg | ORAL_TABLET | Freq: Four times a day (QID) | ORAL | Status: DC | PRN

## 2020-12-31 MED ORDER — ACETAMINOPHEN 325 MG PO TABS
650.0000 mg | ORAL_TABLET | Freq: Four times a day (QID) | ORAL | Status: DC | PRN
  Administered 2021-01-03: 650 mg via ORAL
  Filled 2020-12-31: qty 2

## 2020-12-31 MED ORDER — LACTATED RINGERS IV SOLN: INTRAVENOUS | Status: AC

## 2020-12-31 MED ORDER — LACTATED RINGERS IV BOLUS
500.0000 mL | Freq: Once | INTRAVENOUS | Status: AC
  Administered 2020-12-31: 500 mL via INTRAVENOUS

## 2020-12-31 MED ORDER — ACETAMINOPHEN 650 MG RE SUPP: 650.0000 mg | Freq: Four times a day (QID) | RECTAL | Status: DC | PRN

## 2020-12-31 MED ORDER — LACTATED RINGERS IV SOLN: INTRAVENOUS | Status: DC

## 2020-12-31 MED ORDER — ONDANSETRON HCL 4 MG/2ML IJ SOLN: 4.0000 mg | Freq: Four times a day (QID) | INTRAMUSCULAR | Status: DC | PRN

## 2020-12-31 NOTE — ED Notes (Signed)
Patient transported to CT 

## 2020-12-31 NOTE — ED Provider Notes (Signed)
[facility] DEPT Provider Note   CSN: [id number] Arrival date & time: 12/31/20  1548     History Chief Complaint  Patient presents with   Nausea    Karen [name] is a 87 y.o. female.  Patient is a 87 year old female with a history of hypertension, hyperlipidemia, carotid artery stenosis status post endarterectomy and prior bladder tumor who is presenting today with her daughter-in-law after being found sitting on the floor.  They spoke with her yesterday and she seemed to be her normal self and she always calls around 10:00 in the morning and today she did not call.  Daughter-in-law attempted to call her multiple times and she did not answer so she went over to her apartment where she lives in independent living.  Patient had her walker in front of her and she was sitting on the floor in her bedroom.  Patient reports that she is not sure what happened but she was walking and just sat down in the floor.  Daughter-in-law had someone help get her back up out of the floor and into her chair and she went to get her something to eat but when she came back she was dry heaving.  Patient currently denies any chest pain, shortness of breath, abdominal pain or nausea.  She reports not eating today and she feels a little bit hungry.  Daughter-in-law feels like she might be slightly off her baseline but otherwise seems pretty clear.  She did have her pajamas on this morning when she came and found her so suspect this happened sometime during the night.  They did note there was some blood in her bathroom and back to her bedroom so at some point she had been in those places.  There is been no new change in medication.  Daughter-in-law did not noticed any unilateral numbness or weakness.  No reported new medication changes.  Patient reports that she occasionally has burning with urination and did have a little bit prior to coming to the emergency room.  No diarrhea.  The history is  provided by the patient and a relative.      Past Medical History:  Diagnosis Date   Anxiety    Arthritis knee   Bladder tumor    Depression    Dyspnea on exertion    GERD (gastroesophageal reflux disease)    Hearing loss BILATERAL AIDS   Hematuria    History of endometrial cancer S/P HYSTERECTOMY  2001   Hyperlipidemia    Hypertension CARDIOLOGIST-- DR Irish Lack-  LOV  MARCH 2013--  REQUESTED NOTE AND EKG   Occlusion and stenosis of carotid artery without mention of cerebral infarction FOLLOWED BY DR Kellie Simmering--  LEFT ICA  0=19%  AND RIGHT ICA  20-395   OCCASIONAL DIZZINESS   PONV (postoperative nausea and vomiting)     Patient Active Problem List   Diagnosis Date Noted   Carotid stenosis 03/29/2014   Aftercare following surgery of the circulatory system 03/29/2014   Aftercare following surgery of the circulatory system, NEC 02/23/2013   Occlusion and stenosis of carotid artery without mention of cerebral infarction 02/18/2012    Past Surgical History:  Procedure Laterality Date   CAROTID ENDARTERECTOMY  01-19-2010  DR LAWSON   Left CEA with Dacron patch   CATARACT EXTRACTION W/ INTRAOCULAR LENS  IMPLANT, BILATERAL     TRANSTHORACIC ECHOCARDIOGRAM  11-24-2009  DR VARANASI   LVEF  65-70%/  MILD MITRAL, ATRIAL AND TRICUSID REGURG.   TRANSURETHRAL RESECTION  OF BLADDER TUMOR  03/16/2012   Procedure: TRANSURETHRAL RESECTION OF BLADDER TUMOR (TURBT);  Surgeon: Claybon Jabs, MD;  Location: [facility];  Service: Urology;  Laterality: N/A;  1 hour requested for this case  CAMERA GYRUS   VAGINAL HYSTERECTOMY  2001   W/  BILATERAL SALPINGO-OOPHECTOMY FOR ENDOMETRIAL CANCER     OB History   No obstetric history on file.     Family History  Problem Relation Age of Onset   Coronary artery disease Father    Hypertension Father    Hypertension Mother    Cancer Mother        Colon    Social History   Tobacco Use   Smoking status: Never   Smokeless tobacco:  Never  Vaping Use   Vaping Use: Never used  Substance Use Topics   Alcohol use: No   Drug use: No    Home Medications Prior to Admission medications   Medication Sig Start Date End Date Taking? Authorizing Provider  amLODipine (NORVASC) 10 MG tablet Take 10 mg by mouth daily.  03/29/17   [provider]  aspirin 325 MG tablet Take 325 mg by mouth as needed for mild pain.     [provider]  aspirin EC 81 MG tablet Take 81 mg by mouth daily.    [provider]  calcium carbonate (TUMS - DOSED IN MG ELEMENTAL CALCIUM) 500 MG chewable tablet Chew 1 tablet by mouth as needed for heartburn.     [provider]  fish oil-omega-3 fatty acids 1000 MG capsule Take 2 g by mouth 2 (two) times daily before a meal.     [provider]  lidocaine (LIDODERM) 5 % Place 1 patch onto the skin daily. Remove & Discard patch within 12 hours or as directed by MD 06/19/17   Sherwood Gambler, MD  Multiple Vitamin (MULTIVITAMIN) tablet Take 1 tablet by mouth daily.    [provider]  pravastatin (PRAVACHOL) 40 MG tablet TAKE 1 TABLET BY MOUTH DAILY 11/02/13   Jettie Booze, MD  traMADol-acetaminophen (ULTRACET) 37.5-325 MG tablet Take 1 tablet by mouth every 6 (six) hours as needed for pain. 06/16/17   [provider]    Allergies    Lisinopril and Celebrex [celecoxib]  Review of Systems   Review of Systems  All other systems reviewed and are negative.  Physical Exam Updated Vital Signs BP (!) 115/55 (BP Location: Left Arm)   Pulse 98   Temp 97.8 F (36.6 C) (Oral)   Resp 16   Ht '5\' 4"'$  (1.626 m)   Wt 61 kg   SpO2 97%   BMI 23.08 kg/m   Physical Exam Vitals and nursing note reviewed.  Constitutional:      General: She is not in acute distress.    Appearance: Normal appearance. She is well-developed and normal weight.  HENT:     Head: Normocephalic and atraumatic.     Mouth/Throat:     Mouth: Mucous membranes are moist.  Eyes:      Extraocular Movements: Extraocular movements intact.     Pupils: Pupils are equal, round, and reactive to light.  Cardiovascular:     Rate and Rhythm: Normal rate and regular rhythm.     Pulses: Normal pulses.     Heart sounds: Normal heart sounds. No murmur heard.   No friction rub.  Pulmonary:     Effort: Pulmonary effort is normal.     Breath sounds: Normal breath  sounds. No wheezing or rales.  Abdominal:     General: Bowel sounds are normal. There is no distension.     Palpations: Abdomen is soft.     Tenderness: There is no abdominal tenderness. There is no guarding or rebound.  Musculoskeletal:        General: No tenderness. Normal range of motion.     Right lower leg: No edema.     Left lower leg: No edema.     Comments: No edema.  Small skin tears noted to bilateral heels.  Ecchymosis  over the left elbow but full ROM and non-tender.  No neck pain at this time and full ROM.  Kyphosis noted.    Skin:    General: Skin is warm and dry.     Findings: No rash.  Neurological:     Mental Status: She is alert and oriented to person, place, and time.     Cranial Nerves: No cranial nerve deficit.     Sensory: No sensory deficit.     Motor: No weakness.  Psychiatric:        Mood and Affect: Mood normal.        Behavior: Behavior normal.    ED Results / Procedures / Treatments   Labs (all labs ordered are listed, but only abnormal results are displayed) Labs Reviewed  URINALYSIS, ROUTINE W REFLEX MICROSCOPIC - Abnormal; Notable for the following components:      Result Value   APPearance HAZY (*)    All other components within normal limits  CBC WITH DIFFERENTIAL/PLATELET - Abnormal; Notable for the following components:   WBC 20.9 (*)    RDW 17.2 (*)    Neutro Abs 18.2 (*)    Abs Immature Granulocytes 0.20 (*)    All other components within normal limits  COMPREHENSIVE METABOLIC PANEL - Abnormal; Notable for the following components:   Sodium 134 (*)    CO2 18 (*)     Glucose, Bld 126 (*)    BUN 36 (*)    Creatinine, Ser 2.60 (*)    GFR, Estimated 16 (*)    All other components within normal limits  LIPASE, BLOOD - Abnormal; Notable for the following components:   Lipase 205 (*)    All other components within normal limits  CK - Abnormal; Notable for the following components:   Total CK 357 (*)    All other components within normal limits  TROPONIN I (HIGH SENSITIVITY) - Abnormal; Notable for the following components:   Troponin I (High Sensitivity) 43 (*)    All other components within normal limits  TROPONIN I (HIGH SENSITIVITY)    EKG EKG Interpretation  Date/Time:  Sunday December 31 2020 18:09:34 EDT Ventricular Rate:  96 PR Interval:  156 QRS Duration: 80 QT Interval:  358 QTC Calculation: 452 R Axis:   36 Text Interpretation: Sinus rhythm with frequent Premature ventricular complexes Possible Anterior infarct , age undetermined No significant change since last tracing Confirmed by Blanchie Dessert [phone]) on 12/31/2020 6:44:56 PM  Radiology CT ABDOMEN PELVIS WO CONTRAST  Result Date: 12/31/2020 CLINICAL DATA:  Nausea, vomiting, elevated lipase and acute kidney injury EXAM: CT ABDOMEN AND PELVIS WITHOUT CONTRAST TECHNIQUE: Multidetector CT imaging of the abdomen and pelvis was performed following the standard protocol without IV contrast. COMPARISON:  CT 01/24/2012 FINDINGS: Lower chest: Atelectatic changes in the lung bases. Normal cardiac size. Coronary artery calcifications. Trace pericardial fluid. Hepatobiliary: Stable benign appearing punctate calcifications in the hepatic dome and left  lobe. No concerning focal liver lesion. Smooth liver surface contour. Normal hepatic attenuation. No visible calcified gallstones or biliary ductal dilatation. No gross abnormality of the gallbladder though evaluation limited by upper abdominal motion artifact. Pancreas: Mild pancreatic atrophy. No pancreatic ductal dilatation or surrounding inflammatory  changes. Spleen: Normal in size. No concerning splenic lesions. Adrenals/Urinary Tract: Normal adrenals. Bilateral renal atrophy. No concerning renal mass. No urolithiasis or hydronephrosis. Bladder is unremarkable for the degree of distention. Stomach/Bowel: Distal esophagus, stomach and duodenum are free of acute or worrisome abnormality. Normal duodenal sweep across the abdomen. No worrisome large or small bowel thickening or dilatation. Appendix is not visualized. No focal inflammation the vicinity of the cecum to suggest an occult appendicitis. Distal colonic diverticulosis without focal inflammation to suggest acute diverticulitis. Vascular/Lymphatic: Limited evaluation the absence of contrast. Extensive atherosclerotic calcifications throughout the aorta and branch vessels without worrisome aneurysm or ectasia. No pathologically enlarged nodes. Reproductive: Uterus is surgically absent. No concerning adnexal lesions. Surgical changes along the bilateral pelvic sidewalls. Other: No abdominopelvic free air or fluid. No bowel containing hernia. Soft tissue thickening superficial to the posterosuperior iliac spines and bilateral iliac crests, as well as several of the spinous processes. Correlate with visual inspection to exclude developing decubitus ulceration. Musculoskeletal: Interval development of a vertebra plana deformity at the L1 level which appears overall chronic in nature. Some focal dextrocurvature noted at this level as well. Additional remote appearing superior endplate deformity L3 and minimal anterior wedging T12. No clear acute fracture or traumatic osseous injury is seen. IMPRESSION: No clear acute CT abnormality to provide cause for patient's symptoms. Specifically, fairly unremarkable appearance of the pancreas and biliary tree accounting for motion artifact. If there is persisting clinical concern, right upper quadrant ultrasound could be obtained. Remote appearing compression deformities  including vertebral plana at L1, increasing superior endplate convexity L3 and anterior wedging T12. Coronary artery atherosclerosis. Aortic Atherosclerosis (ICD10-I70.0). Electronically Signed   By: Lovena Le M.D.   On: 12/31/2020 19:24   CT Head Wo Contrast  Result Date: 12/31/2020 CLINICAL DATA:  Nausea and dry heaving. [place] out of bed this morning. EXAM: CT HEAD WITHOUT CONTRAST TECHNIQUE: Contiguous axial images were obtained from the base of the skull through the vertex without intravenous contrast. COMPARISON:  None. FINDINGS: Brain: No evidence of acute infarction, hemorrhage, hydrocephalus, extra-axial collection or mass lesion/mass effect. There is mild ventricular sulcal enlargement reflecting age-appropriate volume loss. Patchy areas of white matter hypoattenuation are noted bilaterally consistent with mild to moderate chronic microvascular ischemic change. Vascular: No hyperdense vessel or unexpected calcification. Skull: Normal. Negative for fracture or focal lesion. Sinuses/Orbits: Globes and orbits are unremarkable. Visualized sinuses are clear. Other: None. IMPRESSION: 1. No acute intracranial abnormalities. 2. Age-appropriate volume loss and mild to moderate chronic microvascular ischemic change. Electronically Signed   By: Lajean Manes M.D.   On: 12/31/2020 17:32   DG Chest Port 1 View  Result Date: 12/31/2020 CLINICAL DATA:  fall, weakness EXAM: PORTABLE CHEST 1 VIEW COMPARISON:  May 12, 2017 FINDINGS: Evaluation is limited secondary to patient rotation. The cardiomediastinal silhouette is unchanged in contour. No pleural effusion. No pneumothorax. No acute pleuroparenchymal abnormality. Visualized abdomen is unremarkable. Multilevel degenerative changes of the thoracic spine. Skin fold overlying the mid mediastinum. IMPRESSION: No acute cardiopulmonary abnormality. Electronically Signed   By: Valentino Saxon MD   On: 12/31/2020 17:27    Procedures Procedures   Medications  Ordered in ED Medications - No data to display  ED  Course  I have reviewed the triage vital signs and the nursing notes.  Pertinent labs & imaging results that were available during my care of the patient were reviewed by me and considered in my medical decision making (see chart for details).    MDM Rules/Calculators/A&P                           Patient is a 87 year old female presenting today after being found on the floor by her family member.  Unclear the circumstances but patient repeatedly reports that she did not fall but sat down in the floor.  She denied hitting her head and denies any headache.  She does not take any anticoagulation.  She does have 2 small blisters on her heels and a bruise on her elbow where it looks like she was trying to struggle to get up.  She was still wearing her pajamas this morning so this occurred most likely sometime throughout the night and family found her around 11:00 this morning.  She then had an episode of dry heaving but currently denies any symptoms.  She is well-appearing and family member thinks that she is slightly confused but otherwise seems mostly her normal self.  Vital signs today with some minimal tachycardia with a pulse of 98 but normal saturation and respiratory rate.  Will p.o. challenge the patient, EKG, labs and imaging are pending.  No focal neurologic findings at this time.  6:57 PM Today patient has a CBC with a white count of 20,000, normal platelets, normal hemoglobin of 12, CMP with normal LFTs and total bilirubin with creatinine of 2.6.  Last creatinine we have is from 2018 and it was 0.98.  Family member reports she has had it checked regularly with her doctor and they always report it is slightly elevated but she does not know the numbers and cannot look it up on a MyChart account.  Anion gap of 15, lipase of 205 troponin of 43, EKG with sinus tachycardia and occasional PVCs but no other acute findings and urine within normal  limits.  Head CT and chest x-ray without acute findings.  Given lab findings we will do a CT to evaluate and ensure there is no evidence of retained stone or cholecystitis.  Patient given gentle fluids.  We will plan on admission given the fall, inability to get up assuming there was some weakness and now abnormal labs.  8:27 PM CT neg for acute pathology.  MDM   Amount and/or Complexity of Data Reviewed Clinical lab tests: ordered and reviewed Tests in the radiology section of CPT: ordered and reviewed Tests in the medicine section of CPT: ordered and reviewed Independent visualization of images, tracings, or specimens: yes     Final Clinical Impression(s) / ED Diagnoses Final diagnoses:  AKI (acute kidney injury) ([place])  Dehydration  Elevated lipase    Rx / DC Orders ED Discharge Orders     None        Blanchie Dessert, MD 12/31/20 2028

## 2020-12-31 NOTE — H&P (Addendum)
History and Physical    [name] DOB: 07-03-1917 DOA: 12/31/2020  PCP: Leeroy Cha, MD  Patient coming from: ILF  I have personally briefly reviewed patient's old medical records in [place]  Chief Complaint: Found down  HPI: [name] is a 87 y.o. female with medical history significant of HTN, prior bladder tumor, CKD 3a.  Pt presents to ED after being found by daughter-in-law sitting on floor.  They spoke with pt yesterday and she seemed to be her normal self.  She always calls around 10am.  Today she didn't call her daughter-in-law however.  Daughter-in-law attempted to call her multiple times and she didn't answer so she went over to her ILF apartment.  Pt had walker in front of her and she was sitting on floor in her bedroom.  Pt reports she isnt sure what happened but she was walking and just sat down on the floor.  Denies fall.  Daughter-in-law had someone help get her back up out of the floor and into her chair and she went to get her something to eat but when she came back she was dry heaving.  No CP, SOB, abd pain, nausea, feels hungry.  Daughter-in-law feels like she might be slightly off her baseline but otherwise seems pretty clear.  She did have her pajamas on this morning when she came and found her so suspect this happened sometime during the night.  They did note there was some blood in her bathroom and back to her bedroom so at some point she had been in those places.  There is been no new change in medication.   ED Course: Trop 43 and 49.  WBC 20.9k  Lipase 205.  Creat 2.6, unclear baseline but CKD 3a according to PCP billing from Aug 2021.  CT A/P: no acute findings, does have B renal atrophy.  Given 500cc LR bolus in ED.   Review of Systems: As per HPI, otherwise all review of systems negative.  Past Medical History:  Diagnosis Date   Anxiety    Arthritis knee   Bladder tumor    Depression    Dyspnea on  exertion    GERD (gastroesophageal reflux disease)    Hearing loss BILATERAL AIDS   Hematuria    History of endometrial cancer S/P HYSTERECTOMY  2001   Hyperlipidemia    Hypertension CARDIOLOGIST-- DR Irish Lack-  LOV  MARCH 2013--  REQUESTED NOTE AND EKG   Occlusion and stenosis of carotid artery without mention of cerebral infarction FOLLOWED BY DR Kellie Simmering--  LEFT ICA  0=19%  AND RIGHT ICA  20-395   OCCASIONAL DIZZINESS   PONV (postoperative nausea and vomiting)     Past Surgical History:  Procedure Laterality Date   CAROTID ENDARTERECTOMY  01-19-2010  DR LAWSON   Left CEA with Dacron patch   CATARACT EXTRACTION W/ INTRAOCULAR LENS  IMPLANT, BILATERAL     TRANSTHORACIC ECHOCARDIOGRAM  11-24-2009  DR VARANASI   LVEF  65-70%/  MILD MITRAL, ATRIAL AND TRICUSID REGURG.   TRANSURETHRAL RESECTION OF BLADDER TUMOR  03/16/2012   Procedure: TRANSURETHRAL RESECTION OF BLADDER TUMOR (TURBT);  Surgeon: Claybon Jabs, MD;  Location: [facility];  Service: Urology;  Laterality: N/A;  1 hour requested for this case  CAMERA GYRUS   VAGINAL HYSTERECTOMY  2001   W/  BILATERAL SALPINGO-OOPHECTOMY FOR ENDOMETRIAL CANCER     reports that she has never smoked. She has never used smokeless tobacco. She reports that  she does not drink alcohol and does not use drugs.  Allergies  Allergen Reactions   Lisinopril Swelling   Celebrex [Celecoxib] Hives and Itching    Family History  Problem Relation Age of Onset   Coronary artery disease Father    Hypertension Father    Hypertension Mother    Cancer Mother        Colon     Prior to Admission medications   Medication Sig Start Date End Date Taking? Authorizing Provider  amLODipine (NORVASC) 10 MG tablet Take 10 mg by mouth daily.  03/29/17   [provider]  aspirin 325 MG tablet Take 325 mg by mouth as needed for mild pain.     [provider]  aspirin EC 81 MG tablet Take 81 mg by mouth daily.    [provider]  calcium carbonate (TUMS - DOSED IN MG ELEMENTAL CALCIUM) 500 MG chewable tablet Chew 1 tablet by mouth as needed for heartburn.     [provider]  fish oil-omega-3 fatty acids 1000 MG capsule Take 2 g by mouth 2 (two) times daily before a meal.     [provider]  lidocaine (LIDODERM) 5 % Place 1 patch onto the skin daily. Remove & Discard patch within 12 hours or as directed by MD 06/19/17   Sherwood Gambler, MD  Multiple Vitamin (MULTIVITAMIN) tablet Take 1 tablet by mouth daily.    [provider]  pravastatin (PRAVACHOL) 40 MG tablet TAKE 1 TABLET BY MOUTH DAILY 11/02/13   Jettie Booze, MD  traMADol-acetaminophen (ULTRACET) 37.5-325 MG tablet Take 1 tablet by mouth every 6 (six) hours as needed for pain. 06/16/17   [provider]    Physical Exam: Vitals:   12/31/20 1559 12/31/20 1609 12/31/20 1612 12/31/20 1929  BP: (!) 115/55  (!) 115/55 106/84  Pulse: (!) 50  98 96  Resp: '16  16 18  '$ Temp: 97.8 F (36.6 C)  97.8 F (36.6 C) 98.4 F (36.9 C)  TempSrc: Oral  Oral Oral  SpO2: 98%  97% 97%  Weight:  61 kg    Height:  '5\' 4"'$  (1.626 m)      Constitutional: NAD, calm, comfortable, elderly Eyes: PERRL, lids and conjunctivae normal ENMT: Very hard of hearing Neck: normal, supple, no masses, no thyromegaly Respiratory: clear to auscultation bilaterally, no wheezing, no crackles. Normal respiratory effort. No accessory muscle use.  Cardiovascular: Regular rate and rhythm, no murmurs / rubs / gallops. No extremity edema. 2+ pedal pulses. No carotid bruits.  Abdomen: no tenderness, no masses palpated. No hepatosplenomegaly. Bowel sounds positive.  Musculoskeletal: no clubbing / cyanosis. No joint deformity upper and lower extremities. Good ROM, no contractures. Normal muscle tone.  Skin: Small skin tears B heels, ecchymosis over L elbow. Neurologic: CN 2-12 grossly intact. Sensation intact, DTR normal. Strength 5/5 in all 4.   Psychiatric: Normal judgment and insight. Alert and oriented x 3. Normal mood.    Labs on Admission: I have personally reviewed following labs and imaging studies  CBC: Recent Labs  Lab 12/31/20 1701  WBC 20.9*  NEUTROABS 18.2*  HGB 12.8  HCT 40.6  MCV 89.4  PLT [id number]   Basic Metabolic Panel: Recent Labs  Lab 12/31/20 1701  NA 134*  K 4.1  CL 101  CO2 18*  GLUCOSE 126*  BUN 36*  CREATININE 2.60*  CALCIUM 9.4   GFR: Estimated Creatinine Clearance: 9.7 mL/min (A) (by C-G formula based on SCr of 2.6  mg/dL (H)). Liver Function Tests: Recent Labs  Lab 12/31/20 1701  AST 37  ALT 21  ALKPHOS 77  BILITOT 0.9  PROT 7.5  ALBUMIN 4.0   Recent Labs  Lab 12/31/20 1701  LIPASE 205*   No results for input(s): AMMONIA in the last 168 hours. Coagulation Profile: No results for input(s): INR, PROTIME in the last 168 hours. Cardiac Enzymes: Recent Labs  Lab 12/31/20 1701  CKTOTAL 357*   BNP (last 3 results) No results for input(s): PROBNP in the last 8760 hours. HbA1C: No results for input(s): HGBA1C in the last 72 hours. CBG: No results for input(s): GLUCAP in the last 168 hours. Lipid Profile: No results for input(s): CHOL, HDL, LDLCALC, TRIG, CHOLHDL, LDLDIRECT in the last 72 hours. Thyroid Function Tests: No results for input(s): TSH, T4TOTAL, FREET4, T3FREE, THYROIDAB in the last 72 hours. Anemia Panel: No results for input(s): VITAMINB12, FOLATE, FERRITIN, TIBC, IRON, RETICCTPCT in the last 72 hours. Urine analysis:    Component Value Date/Time   COLORURINE YELLOW 12/31/2020 1600   APPEARANCEUR HAZY (A) 12/31/2020 1600   LABSPEC 1.013 12/31/2020 1600   PHURINE 5.0 12/31/2020 1600   GLUCOSEU NEGATIVE 12/31/2020 1600   HGBUR NEGATIVE 12/31/2020 1600   BILIRUBINUR NEGATIVE 12/31/2020 1600   KETONESUR NEGATIVE 12/31/2020 1600   PROTEINUR NEGATIVE 12/31/2020 1600   UROBILINOGEN 0.2 01/16/2010 1147   NITRITE NEGATIVE 12/31/2020 1600   LEUKOCYTESUR  NEGATIVE 12/31/2020 1600    Radiological Exams on Admission: CT ABDOMEN PELVIS WO CONTRAST  Result Date: 12/31/2020 CLINICAL DATA:  Nausea, vomiting, elevated lipase and acute kidney injury EXAM: CT ABDOMEN AND PELVIS WITHOUT CONTRAST TECHNIQUE: Multidetector CT imaging of the abdomen and pelvis was performed following the standard protocol without IV contrast. COMPARISON:  CT 01/24/2012 FINDINGS: Lower chest: Atelectatic changes in the lung bases. Normal cardiac size. Coronary artery calcifications. Trace pericardial fluid. Hepatobiliary: Stable benign appearing punctate calcifications in the hepatic dome and left lobe. No concerning focal liver lesion. Smooth liver surface contour. Normal hepatic attenuation. No visible calcified gallstones or biliary ductal dilatation. No gross abnormality of the gallbladder though evaluation limited by upper abdominal motion artifact. Pancreas: Mild pancreatic atrophy. No pancreatic ductal dilatation or surrounding inflammatory changes. Spleen: Normal in size. No concerning splenic lesions. Adrenals/Urinary Tract: Normal adrenals. Bilateral renal atrophy. No concerning renal mass. No urolithiasis or hydronephrosis. Bladder is unremarkable for the degree of distention. Stomach/Bowel: Distal esophagus, stomach and duodenum are free of acute or worrisome abnormality. Normal duodenal sweep across the abdomen. No worrisome large or small bowel thickening or dilatation. Appendix is not visualized. No focal inflammation the vicinity of the cecum to suggest an occult appendicitis. Distal colonic diverticulosis without focal inflammation to suggest acute diverticulitis. Vascular/Lymphatic: Limited evaluation the absence of contrast. Extensive atherosclerotic calcifications throughout the aorta and branch vessels without worrisome aneurysm or ectasia. No pathologically enlarged nodes. Reproductive: Uterus is surgically absent. No concerning adnexal lesions. Surgical changes along  the bilateral pelvic sidewalls. Other: No abdominopelvic free air or fluid. No bowel containing hernia. Soft tissue thickening superficial to the posterosuperior iliac spines and bilateral iliac crests, as well as several of the spinous processes. Correlate with visual inspection to exclude developing decubitus ulceration. Musculoskeletal: Interval development of a vertebra plana deformity at the L1 level which appears overall chronic in nature. Some focal dextrocurvature noted at this level as well. Additional remote appearing superior endplate deformity L3 and minimal anterior wedging T12. No clear acute fracture or traumatic osseous injury is seen. IMPRESSION: No  clear acute CT abnormality to provide cause for patient's symptoms. Specifically, fairly unremarkable appearance of the pancreas and biliary tree accounting for motion artifact. If there is persisting clinical concern, right upper quadrant ultrasound could be obtained. Remote appearing compression deformities including vertebral plana at L1, increasing superior endplate convexity L3 and anterior wedging T12. Coronary artery atherosclerosis. Aortic Atherosclerosis (ICD10-I70.0). Electronically Signed   By: Lovena Le M.D.   On: 12/31/2020 19:24   CT Head Wo Contrast  Result Date: 12/31/2020 CLINICAL DATA:  Nausea and dry heaving. [place] out of bed this morning. EXAM: CT HEAD WITHOUT CONTRAST TECHNIQUE: Contiguous axial images were obtained from the base of the skull through the vertex without intravenous contrast. COMPARISON:  None. FINDINGS: Brain: No evidence of acute infarction, hemorrhage, hydrocephalus, extra-axial collection or mass lesion/mass effect. There is mild ventricular sulcal enlargement reflecting age-appropriate volume loss. Patchy areas of white matter hypoattenuation are noted bilaterally consistent with mild to moderate chronic microvascular ischemic change. Vascular: No hyperdense vessel or unexpected calcification. Skull: Normal.  Negative for fracture or focal lesion. Sinuses/Orbits: Globes and orbits are unremarkable. Visualized sinuses are clear. Other: None. IMPRESSION: 1. No acute intracranial abnormalities. 2. Age-appropriate volume loss and mild to moderate chronic microvascular ischemic change. Electronically Signed   By: Lajean Manes M.D.   On: 12/31/2020 17:32   US RENAL  Result Date: 12/31/2020 CLINICAL DATA:  Acute kidney injury. Trans urethral resection of bladder tumor. Hypertension. EXAM: RENAL / URINARY TRACT ULTRASOUND COMPLETE COMPARISON:  CT abdomen and pelvis 12/31/2020 FINDINGS: Right Kidney: Renal measurements: 7.9 x 3.7 x 4.6 cm = volume: 70 mL. Diffuse parenchymal atrophy with increased echotexture of the renal parenchyma suggesting chronic medical renal disease. No hydronephrosis. Left Kidney: Renal measurements: 7.7 x 4.1 x 4.5 cm = volume: 44 mL. Diffuse parenchymal atrophy with increased echotexture of the parenchyma suggesting chronic medical renal disease. No hydronephrosis. Bladder: Appears normal for degree of bladder distention. Other: None. IMPRESSION: Bilateral renal parenchymal atrophy. No focal lesion. No hydronephrosis. Electronically Signed   By: Lucienne Capers M.D.   On: 12/31/2020 21:41   DG Chest Port 1 View  Result Date: 12/31/2020 CLINICAL DATA:  fall, weakness EXAM: PORTABLE CHEST 1 VIEW COMPARISON:  May 12, 2017 FINDINGS: Evaluation is limited secondary to patient rotation. The cardiomediastinal silhouette is unchanged in contour. No pleural effusion. No pneumothorax. No acute pleuroparenchymal abnormality. Visualized abdomen is unremarkable. Multilevel degenerative changes of the thoracic spine. Skin fold overlying the mid mediastinum. IMPRESSION: No acute cardiopulmonary abnormality. Electronically Signed   By: Valentino Saxon MD   On: 12/31/2020 17:27    EKG: Independently reviewed.  Assessment/Plan Principal Problem:   AKI (acute kidney injury) ([place]) Active Problems:    Leukocytosis   Elevated lipase   Metabolic acidosis, NAG, failure of bicarbonate regeneration   Chronic kidney disease, stage 3a (HCC)    AKI on CKD 3a - Vs progression of CKD in past year Gentle hydration with LR for tonight Repeat CMP in AM Getting renal US, but no hydro on CT AP, just B renal atrophy Strict intake and output Elevated lipase - Had nausea earlier, but no CT findings to suggest pancreatitis. Possibly just elevated due to renal function? Pt hungry, will let her eat and see how she responds Leukocytosis - Stress related? CXR clear, UA neg, no obvious source of infection No other SIRS at this time Repeat CBC in AM NAG metabolic acidosis - Mild Monitor with repeat CMP in AM Possibly due to progression of  CKD?  DVT prophylaxis: SCDs, cont ASA Code Status: DNR - Most form in chart Family Communication: Daughter-in-law at bedside Disposition Plan: TBD - ALF? PT/OT to eval Consults called: None Admission status: Place in obs     Mark Benecke, [place] [facility]  How to contact the [facility] Attending or Consulting provider [place] or covering provider during after hours [place], for this patient?  Check the care team in [facility] and look for a) attending/consulting [facility] provider listed and b) the [facility] team listed Log into [email or web address]  Amion Physician Scheduling and messaging for groups and whole hospitals  On call and physician scheduling software for group practices, residents, hospitalists and other medical providers for call, clinic, rotation and shift schedules. On[employer] is a hospital-wide system for scheduling doctors and paging doctors on call. EasyPlot is for scientific plotting and data analysis.  [email or web address]  and use [place]'s universal password to access. If you do not have the password, please contact the hospital operator.  Locate the [facility] provider you are looking for under [facility] and page to a number that you can be directly  reached. If you still have difficulty reaching the provider, please page the [facility] (Director on Call) for the Hospitalists listed on amion for assistance.  12/31/2020, 9:46 PM

## 2020-12-31 NOTE — ED Notes (Signed)
Patient transported to CT and XRAY. 

## 2020-12-31 NOTE — ED Triage Notes (Signed)
Patient BIB [facility] from assisted living facility c/o nausea and dry heaving, EMS was called a second time due to patient having one episode of dry heaving but none since.  Family insisted that patient be transported. EMS placed 20g left FA and gave '4mg'$  zofran IV in route. EMS thinks that family may have possibly wanted the patient transported here to try to get her into a higher level of care.  EMS was called out this morning for a fall when getting out of the bed.  Patient had a small laceration to her right middle finger, EMS assessed but did not transport patient.  Vitals were 128/62 98-HR 16-RR 97% room air 128-CBG 98.1-temp

## 2021-01-01 DIAGNOSIS — R339 Retention of urine, unspecified: Secondary | ICD-10-CM | POA: Diagnosis present

## 2021-01-01 DIAGNOSIS — Z66 Do not resuscitate: Secondary | ICD-10-CM | POA: Diagnosis present

## 2021-01-01 DIAGNOSIS — R278 Other lack of coordination: Secondary | ICD-10-CM | POA: Diagnosis not present

## 2021-01-01 DIAGNOSIS — R778 Other specified abnormalities of plasma proteins: Secondary | ICD-10-CM | POA: Diagnosis present

## 2021-01-01 DIAGNOSIS — R410 Disorientation, unspecified: Secondary | ICD-10-CM | POA: Diagnosis not present

## 2021-01-01 DIAGNOSIS — Z79899 Other long term (current) drug therapy: Secondary | ICD-10-CM | POA: Diagnosis not present

## 2021-01-01 DIAGNOSIS — Z888 Allergy status to other drugs, medicaments and biological substances status: Secondary | ICD-10-CM | POA: Diagnosis not present

## 2021-01-01 DIAGNOSIS — Z23 Encounter for immunization: Secondary | ICD-10-CM | POA: Diagnosis not present

## 2021-01-01 DIAGNOSIS — E861 Hypovolemia: Secondary | ICD-10-CM | POA: Diagnosis present

## 2021-01-01 DIAGNOSIS — Z7982 Long term (current) use of aspirin: Secondary | ICD-10-CM | POA: Diagnosis not present

## 2021-01-01 DIAGNOSIS — R2681 Unsteadiness on feet: Secondary | ICD-10-CM | POA: Diagnosis not present

## 2021-01-01 DIAGNOSIS — R41841 Cognitive communication deficit: Secondary | ICD-10-CM | POA: Diagnosis not present

## 2021-01-01 DIAGNOSIS — M150 Primary generalized (osteo)arthritis: Secondary | ICD-10-CM | POA: Diagnosis not present

## 2021-01-01 DIAGNOSIS — E871 Hypo-osmolality and hyponatremia: Secondary | ICD-10-CM

## 2021-01-01 DIAGNOSIS — K219 Gastro-esophageal reflux disease without esophagitis: Secondary | ICD-10-CM | POA: Diagnosis present

## 2021-01-01 DIAGNOSIS — Z20822 Contact with and (suspected) exposure to covid-19: Secondary | ICD-10-CM | POA: Diagnosis present

## 2021-01-01 DIAGNOSIS — Z8249 Family history of ischemic heart disease and other diseases of the circulatory system: Secondary | ICD-10-CM | POA: Diagnosis not present

## 2021-01-01 DIAGNOSIS — Z886 Allergy status to analgesic agent status: Secondary | ICD-10-CM | POA: Diagnosis not present

## 2021-01-01 DIAGNOSIS — D72829 Elevated white blood cell count, unspecified: Secondary | ICD-10-CM | POA: Diagnosis present

## 2021-01-01 DIAGNOSIS — N1831 Chronic kidney disease, stage 3a: Secondary | ICD-10-CM | POA: Diagnosis present

## 2021-01-01 DIAGNOSIS — E785 Hyperlipidemia, unspecified: Secondary | ICD-10-CM | POA: Diagnosis present

## 2021-01-01 DIAGNOSIS — I129 Hypertensive chronic kidney disease with stage 1 through stage 4 chronic kidney disease, or unspecified chronic kidney disease: Secondary | ICD-10-CM | POA: Diagnosis present

## 2021-01-01 DIAGNOSIS — R748 Abnormal levels of other serum enzymes: Secondary | ICD-10-CM | POA: Diagnosis present

## 2021-01-01 DIAGNOSIS — R338 Other retention of urine: Secondary | ICD-10-CM | POA: Diagnosis not present

## 2021-01-01 DIAGNOSIS — M6281 Muscle weakness (generalized): Secondary | ICD-10-CM | POA: Diagnosis not present

## 2021-01-01 DIAGNOSIS — E860 Dehydration: Secondary | ICD-10-CM | POA: Diagnosis not present

## 2021-01-01 DIAGNOSIS — E872 Acidosis: Secondary | ICD-10-CM | POA: Diagnosis present

## 2021-01-01 DIAGNOSIS — F419 Anxiety disorder, unspecified: Secondary | ICD-10-CM | POA: Diagnosis present

## 2021-01-01 DIAGNOSIS — F32A Depression, unspecified: Secondary | ICD-10-CM | POA: Diagnosis present

## 2021-01-01 DIAGNOSIS — Z8542 Personal history of malignant neoplasm of other parts of uterus: Secondary | ICD-10-CM | POA: Diagnosis not present

## 2021-01-01 DIAGNOSIS — H9193 Unspecified hearing loss, bilateral: Secondary | ICD-10-CM | POA: Diagnosis not present

## 2021-01-01 DIAGNOSIS — N261 Atrophy of kidney (terminal): Secondary | ICD-10-CM | POA: Diagnosis present

## 2021-01-01 DIAGNOSIS — Z7401 Bed confinement status: Secondary | ICD-10-CM | POA: Diagnosis not present

## 2021-01-01 DIAGNOSIS — N179 Acute kidney failure, unspecified: Secondary | ICD-10-CM | POA: Diagnosis present

## 2021-01-01 DIAGNOSIS — R29898 Other symptoms and signs involving the musculoskeletal system: Secondary | ICD-10-CM | POA: Diagnosis not present

## 2021-01-01 LAB — CBC
HCT: 37.8 % (ref 36.0–46.0)
Hemoglobin: 12 g/dL (ref 12.0–15.0)
MCH: 28.6 pg (ref 26.0–34.0)
MCHC: 31.7 g/dL (ref 30.0–36.0)
MCV: 90.2 fL (ref 80.0–100.0)
Platelets: 275 10*3/uL (ref 150–400)
RBC: 4.19 MIL/uL (ref 3.87–5.11)
RDW: 17.1 % — ABNORMAL HIGH (ref 11.5–15.5)
WBC: 18.1 10*3/uL — ABNORMAL HIGH (ref 4.0–10.5)
nRBC: 0 % (ref 0.0–0.2)

## 2021-01-01 LAB — COMPREHENSIVE METABOLIC PANEL
ALT: 18 U/L (ref 0–44)
AST: 31 U/L (ref 15–41)
Albumin: 3.2 g/dL — ABNORMAL LOW (ref 3.5–5.0)
Alkaline Phosphatase: 61 U/L (ref 38–126)
Anion gap: 10 (ref 5–15)
BUN: 34 mg/dL — ABNORMAL HIGH (ref 8–23)
CO2: 20 mmol/L — ABNORMAL LOW (ref 22–32)
Calcium: 8.7 mg/dL — ABNORMAL LOW (ref 8.9–10.3)
Chloride: 104 mmol/L (ref 98–111)
Creatinine, Ser: 1.95 mg/dL — ABNORMAL HIGH (ref 0.44–1.00)
GFR, Estimated: 22 mL/min — ABNORMAL LOW (ref >60)
Glucose, Bld: 97 mg/dL (ref 70–99)
Potassium: 4.3 mmol/L (ref 3.5–5.1)
Sodium: 134 mmol/L — ABNORMAL LOW (ref 135–145)
Total Bilirubin: 0.7 mg/dL (ref 0.3–1.2)
Total Protein: 6 g/dL — ABNORMAL LOW (ref 6.5–8.1)

## 2021-01-01 LAB — RESP PANEL BY RT-PCR (FLU A&B, COVID) ARPGX2
Influenza A by PCR: NEGATIVE
Influenza B by PCR: NEGATIVE
SARS Coronavirus 2 by RT PCR: NEGATIVE

## 2021-01-01 MED ORDER — POLYVINYL ALCOHOL 1.4 % OP SOLN
2.0000 [drp] | OPHTHALMIC | Status: DC | PRN
  Filled 2021-01-01: qty 15

## 2021-01-01 NOTE — ED Notes (Signed)
Therapy at bedside.

## 2021-01-01 NOTE — Evaluation (Signed)
Physical Therapy Evaluation Patient Details Name: Karen [name] MRN: [id number] DOB: 06/01/1918 Today's Date: 01/01/2021   History of Present Illness  87 y.o. female with medical history significant of HTN, prior bladder tumor, CKD 3a.  Patient presented to the emergency department after being found on the floor by her daughter-in-law.  She seemed a bit confused.  Patient was found to have acute renal failure.  According to daughter-in-law patient has had poor oral intake the last few weeks.   Pt initially denied falling, but endorsed a fall during OT Evaluation.  Clinical Impression  Pt admitted as above and presenting with functional mobility limitations 2* generalized weakness and balance deficits placing pt at high risk for calls.  Pt would benefit from follow up rehab at SNF level to maximize IND and safety.    Follow Up Recommendations SNF    Equipment Recommendations  None recommended by PT    Recommendations for Other Services       Precautions / Restrictions Precautions Precautions: Fall Restrictions Weight Bearing Restrictions: No      Mobility  Bed Mobility Overal bed mobility: Needs Assistance Bed Mobility: Supine to Sit;Sit to Supine     Supine to sit: Supervision Sit to supine: Min assist   General bed mobility comments: Min Assist to fully raise LEs onto stretcher in ED.    Transfers Overall transfer level: Needs assistance   Transfers: Sit to/from Stand Sit to Stand: Min assist         General transfer comment: Steady assist  Ambulation/Gait Ambulation/Gait assistance: Min assist Gait Distance (Feet): 250 Feet Assistive device: Rolling walker (2 wheeled) Gait Pattern/deviations: Step-through pattern;Shuffle;Trunk flexed     General Gait Details: General instability most notable on turning corners with pt at higher risk for falls but no over LOB  Stairs            Wheelchair Mobility    Modified Rankin (Stroke Patients Only)        Balance Overall balance assessment: History of Falls;Needs assistance Sitting-balance support: Feet unsupported Sitting balance-Leahy Scale: Good   Postural control: Posterior lean Standing balance support: Bilateral upper extremity supported Standing balance-Leahy Scale: Poor                               Pertinent Vitals/Pain Pain Assessment: No/denies pain    Home Living Family/patient expects to be discharged to:: Skilled nursing facility Living Arrangements: Alone   Type of Home: Apartment Home Access: Ramped entrance;Elevator     Home Layout: One level Home Equipment: Walker - 4 wheels      Prior Function Level of Independence: Independent with assistive device(s)         Comments: Pt uses her Rollator at all times. DIL endorses one other fall this year when pt forgot to lock brakes on her Rollator prior to sitting. Pt receives private pay housekeeping services and DIL or [place] provides transportation. Per DIL, pt engages in many of the planned activities at the [facility] and stays active.  Per DIL, pt has had a recent, gradual decline in her ablity to perform ADLs.     Hand Dominance   Dominant Hand: Right    Extremity/Trunk Assessment   Upper Extremity Assessment Upper Extremity Assessment: Defer to OT evaluation    Lower Extremity Assessment Lower Extremity Assessment: Generalized weakness (Pt reports numbness bil LEs)    Cervical / Trunk Assessment Cervical / Trunk Assessment: Kyphotic  Communication  Communication: No difficulties  Cognition Arousal/Alertness: Awake/alert Behavior During Therapy: WFL for tasks assessed/performed Overall Cognitive Status: History of cognitive impairments - at baseline                                 General Comments: Pt is pleasant, alert and oriented to person, place and situation. Not to month or year. DIL assisted with recent history. Pt also very HOH with a left hearing aid.       General Comments      Exercises     Assessment/Plan    PT Assessment Patient needs continued PT services  PT Problem List Decreased strength;Decreased activity tolerance;Decreased balance;Decreased mobility       PT Treatment Interventions DME instruction;Gait training;Stair training;Functional mobility training;Therapeutic activities;Therapeutic exercise;Patient/family education    PT Goals (Current goals can be found in the Care Plan section)  Acute Rehab PT Goals Patient Stated Goal: Regain IND PT Goal Formulation: With patient Time For Goal Achievement: 01/15/21 Potential to Achieve Goals: Good    Frequency Min 3X/week   Barriers to discharge        Co-evaluation PT/OT/SLP Co-Evaluation/Treatment: Yes Reason for Co-Treatment: To address functional/ADL transfers PT goals addressed during session: Mobility/safety with mobility OT goals addressed during session: ADL's and self-care       AM-PAC PT "6 Clicks" Mobility  Outcome Measure Help needed turning from your back to your side while in a flat bed without using bedrails?: None Help needed moving from lying on your back to sitting on the side of a flat bed without using bedrails?: A Little Help needed moving to and from a bed to a chair (including a wheelchair)?: A Little Help needed standing up from a chair using your arms (e.g., wheelchair or bedside chair)?: A Little Help needed to walk in hospital room?: A Little Help needed climbing 3-5 steps with a railing? : A Lot 6 Click Score: 18    End of Session Equipment Utilized During Treatment: Gait belt Activity Tolerance: Patient tolerated treatment well Patient left: in bed;with call bell/phone within reach;with family/visitor present Nurse Communication: Mobility status PT Visit Diagnosis: Unsteadiness on feet (R26.81);Muscle weakness (generalized) (M62.81)    Time: [id number] PT Time Calculation (min) (ACUTE ONLY): 22 min   Charges:   PT  Evaluation $PT Eval Low Complexity: 1 Low          [place] Pager [phone] Office [phone]   Hokulani Rogel 01/01/2021, 1:26 PM

## 2021-01-01 NOTE — Progress Notes (Signed)
[facility] PROGRESS NOTE   Karen [name] [id number] DOB: 05/15/1918 DOA: 12/31/2020  PCP: Leeroy Cha, MD  Brief History/Interval Summary: 87 y.o. female with medical history significant of HTN, prior bladder tumor, CKD 3a.  Patient presented to the emergency department after being found on the floor by her daughter-in-law.  She seemed a bit confused.  Patient was found to have acute renal failure.  According to daughter-in-law patient has had poor oral intake the last few weeks.  Consultants: None  Procedures: None  Antibiotics: Anti-infectives (From admission, onward)    None       Subjective/Interval History: Patient is hard of hearing.  Repeats questions at times.  Daughter-in-law is at the bedside.  Nuys any chest pain or shortness of breath.  However does mention discomfort in the lower abdomen.  No nausea vomiting or diarrhea recently.    Assessment/Plan:  Acute kidney injury on chronic kidney disease stage IIIa/hyponatremia Based on labs from 2018 her creatinine at that time was 0.9.  Presented with creatinine of 2.6.  Patient was noted to be dehydrated.  Renal ultrasound does not show any hydronephrosis.  CT scan did not show any significant abnormalities.  Renal atrophy was noted on imaging studies.  Patient's daughter-in-law mentioned that patient has been having poor oral intake for the past few weeks.  She also felt that the patient has been slowly declining over the past many months.  No GI illness has been present recently.  Continue with the gentle IV hydration for now.  Monitor urine output.  Acute urinary retention Patient noted to have distended bladder on examination.  Bladder scan revealed greater than 700 mL of urine.  In and out catheterization has been ordered.  Weight done yesterday evening does not suggest infection.  Patient does have a history of bladder cancer but no blood was noted either.  Imaging studies did not show any  significant abnormalities involving the urinary bladder.  Elevated lipase Abdomen is otherwise benign.  Elevated lipase could be due to acute renal failure.  Continue to monitor clinically for now.  Leukocytosis Most likely reactive.  No obvious source of infection has been found.  She is afebrile.  Mildly elevated troponin Clinical significance unclear as patient denies any chest pain.  Normal anion gap metabolic acidosis Likely due to hypovolemia.  Now improved.  Physical deconditioning Will involve PT and OT.  She lives in an independent living facility.  May need higher level of care.    DVT Prophylaxis: SCDs Code Status: DNR Family Communication: Discussed with patient's daughter-in-law at bedside Disposition Plan: May need higher level of care.  PT and OT evaluation.  Status is: Observation  The patient will require care spanning > 2 midnights and should be moved to inpatient because: IV treatments appropriate due to intensity of illness or inability to take PO and Inpatient level of care appropriate due to severity of illness  Dispo: The patient is from:  Independent living facility              Anticipated d/c is to:  To be determined              Patient currently is not medically stable to d/c.   Difficult to place patient No         Medications: Scheduled: Continuous:  lactated ringers 75 mL/hr at 01/01/21 0458   [id number] **OR** acetaminophen, ondansetron **OR** ondansetron (ZOFRAN) IV   Objective:  Vital Signs  Vitals:   01/01/21 0143  01/01/21 0300 01/01/21 0600 01/01/21 0800  BP: (!) 124/54 (!) 129/56 (!) 124/57 (!) 132/58  Pulse: 79 84 78 86  Resp: '14 14 14 16  '$ Temp:      TempSrc:      SpO2: 96% 94% 96% 95%  Weight:      Height:        Intake/Output Summary (Last 24 hours) at 01/01/2021 0956 Last data filed at 01/01/2021 0920 Gross per 24 hour  Intake --  Output 1000 ml  Net -1000 ml   Filed Weights   12/31/20 1609  Weight:  61 kg    General appearance: Awake alert.  In no distress.  Mildly distracted Resp: Clear to auscultation bilaterally.  Normal effort Cardio: S1-S2 is normal regular.  No S3-S4.  No rubs murmurs or bruit GI: Abdomen is soft.  Fullness appreciated in the suprapubic area likely due to distended bladder.  Mildly tender.   Extremities: No edema.  Full range of motion of lower extremities. Neurologic: No focal neurological deficits.    Lab Results:  Data Reviewed: I have personally reviewed following labs and imaging studies  CBC: Recent Labs  Lab 12/31/20 1701 01/01/21 0301  WBC 20.9* 18.1*  NEUTROABS 18.2*  --   HGB 12.8 12.0  HCT 40.6 37.8  MCV 89.4 90.2  PLT 308 [id number]    Basic Metabolic Panel: Recent Labs  Lab 12/31/20 1701 01/01/21 0301  NA 134* 134*  K 4.1 4.3  CL 101 104  CO2 18* 20*  GLUCOSE 126* 97  BUN 36* 34*  CREATININE 2.60* 1.95*  CALCIUM 9.4 8.7*    GFR: Estimated Creatinine Clearance: 12.9 mL/min (A) (by C-G formula based on SCr of 1.95 mg/dL (H)).  Liver Function Tests: Recent Labs  Lab 12/31/20 1701 01/01/21 0301  AST 37 31  ALT 21 18  ALKPHOS 77 61  BILITOT 0.9 0.7  PROT 7.5 6.0*  ALBUMIN 4.0 3.2*    Recent Labs  Lab 12/31/20 1701  LIPASE 205*     Cardiac Enzymes: Recent Labs  Lab 12/31/20 1701  CKTOTAL 357*     Radiology Studies: CT ABDOMEN PELVIS WO CONTRAST  Result Date: 12/31/2020 CLINICAL DATA:  Nausea, vomiting, elevated lipase and acute kidney injury EXAM: CT ABDOMEN AND PELVIS WITHOUT CONTRAST TECHNIQUE: Multidetector CT imaging of the abdomen and pelvis was performed following the standard protocol without IV contrast. COMPARISON:  CT 01/24/2012 FINDINGS: Lower chest: Atelectatic changes in the lung bases. Normal cardiac size. Coronary artery calcifications. Trace pericardial fluid. Hepatobiliary: Stable benign appearing punctate calcifications in the hepatic dome and left lobe. No concerning focal liver lesion. Smooth  liver surface contour. Normal hepatic attenuation. No visible calcified gallstones or biliary ductal dilatation. No gross abnormality of the gallbladder though evaluation limited by upper abdominal motion artifact. Pancreas: Mild pancreatic atrophy. No pancreatic ductal dilatation or surrounding inflammatory changes. Spleen: Normal in size. No concerning splenic lesions. Adrenals/Urinary Tract: Normal adrenals. Bilateral renal atrophy. No concerning renal mass. No urolithiasis or hydronephrosis. Bladder is unremarkable for the degree of distention. Stomach/Bowel: Distal esophagus, stomach and duodenum are free of acute or worrisome abnormality. Normal duodenal sweep across the abdomen. No worrisome large or small bowel thickening or dilatation. Appendix is not visualized. No focal inflammation the vicinity of the cecum to suggest an occult appendicitis. Distal colonic diverticulosis without focal inflammation to suggest acute diverticulitis. Vascular/Lymphatic: Limited evaluation the absence of contrast. Extensive atherosclerotic calcifications throughout the aorta and branch vessels without worrisome aneurysm or ectasia. No pathologically  enlarged nodes. Reproductive: Uterus is surgically absent. No concerning adnexal lesions. Surgical changes along the bilateral pelvic sidewalls. Other: No abdominopelvic free air or fluid. No bowel containing hernia. Soft tissue thickening superficial to the posterosuperior iliac spines and bilateral iliac crests, as well as several of the spinous processes. Correlate with visual inspection to exclude developing decubitus ulceration. Musculoskeletal: Interval development of a vertebra plana deformity at the L1 level which appears overall chronic in nature. Some focal dextrocurvature noted at this level as well. Additional remote appearing superior endplate deformity L3 and minimal anterior wedging T12. No clear acute fracture or traumatic osseous injury is seen. IMPRESSION: No  clear acute CT abnormality to provide cause for patient's symptoms. Specifically, fairly unremarkable appearance of the pancreas and biliary tree accounting for motion artifact. If there is persisting clinical concern, right upper quadrant ultrasound could be obtained. Remote appearing compression deformities including vertebral plana at L1, increasing superior endplate convexity L3 and anterior wedging T12. Coronary artery atherosclerosis. Aortic Atherosclerosis (ICD10-I70.0). Electronically Signed   By: Lovena Le M.D.   On: 12/31/2020 19:24   CT Head Wo Contrast  Result Date: 12/31/2020 CLINICAL DATA:  Nausea and dry heaving. [place] out of bed this morning. EXAM: CT HEAD WITHOUT CONTRAST TECHNIQUE: Contiguous axial images were obtained from the base of the skull through the vertex without intravenous contrast. COMPARISON:  None. FINDINGS: Brain: No evidence of acute infarction, hemorrhage, hydrocephalus, extra-axial collection or mass lesion/mass effect. There is mild ventricular sulcal enlargement reflecting age-appropriate volume loss. Patchy areas of white matter hypoattenuation are noted bilaterally consistent with mild to moderate chronic microvascular ischemic change. Vascular: No hyperdense vessel or unexpected calcification. Skull: Normal. Negative for fracture or focal lesion. Sinuses/Orbits: Globes and orbits are unremarkable. Visualized sinuses are clear. Other: None. IMPRESSION: 1. No acute intracranial abnormalities. 2. Age-appropriate volume loss and mild to moderate chronic microvascular ischemic change. Electronically Signed   By: Lajean Manes M.D.   On: 12/31/2020 17:32   US RENAL  Result Date: 12/31/2020 CLINICAL DATA:  Acute kidney injury. Trans urethral resection of bladder tumor. Hypertension. EXAM: RENAL / URINARY TRACT ULTRASOUND COMPLETE COMPARISON:  CT abdomen and pelvis 12/31/2020 FINDINGS: Right Kidney: Renal measurements: 7.9 x 3.7 x 4.6 cm = volume: 70 mL. Diffuse parenchymal  atrophy with increased echotexture of the renal parenchyma suggesting chronic medical renal disease. No hydronephrosis. Left Kidney: Renal measurements: 7.7 x 4.1 x 4.5 cm = volume: 44 mL. Diffuse parenchymal atrophy with increased echotexture of the parenchyma suggesting chronic medical renal disease. No hydronephrosis. Bladder: Appears normal for degree of bladder distention. Other: None. IMPRESSION: Bilateral renal parenchymal atrophy. No focal lesion. No hydronephrosis. Electronically Signed   By: Lucienne Capers M.D.   On: 12/31/2020 21:41   DG Chest Port 1 View  Result Date: 12/31/2020 CLINICAL DATA:  fall, weakness EXAM: PORTABLE CHEST 1 VIEW COMPARISON:  May 12, 2017 FINDINGS: Evaluation is limited secondary to patient rotation. The cardiomediastinal silhouette is unchanged in contour. No pleural effusion. No pneumothorax. No acute pleuroparenchymal abnormality. Visualized abdomen is unremarkable. Multilevel degenerative changes of the thoracic spine. Skin fold overlying the mid mediastinum. IMPRESSION: No acute cardiopulmonary abnormality. Electronically Signed   By: Valentino Saxon MD   On: 12/31/2020 17:27       LOS: 0 days   [facility] Pager on [email or web address]  01/01/2021, 9:56 AM

## 2021-01-01 NOTE — Evaluation (Signed)
Occupational Therapy Evaluation Patient Details Name: Karen [name] MRN: [id number] DOB: 06-28-1917 Today's Date: 01/01/2021    History of Present Illness 87 y.o. female with medical history significant of HTN, prior bladder tumor, CKD 3a.  Patient presented to the emergency department after being found on the floor by her daughter-in-law.  She seemed a bit confused.  Patient was found to have acute renal failure.  According to daughter-in-law patient has had poor oral intake the last few weeks.   Pt initially denied falling, but endorsed a fall during OT Evaluation.   Clinical Impression   Patient is currently requiring assistance with ADLs including minimal assist with toileting, with LE dressing, and with bathing, and setup assist with UE dressing and seated grooming, all of which is below patient's typical baseline of being Modified independent however pt's DIL does endorse a gradual decrease in functional ability over past several months.  During this evaluation, patient was limited by generalized weakness and unsteadiness with decreased safety awareness while ambulating and using RW, which has the potential to impact patient's safety and independence during functional mobility, as well as performance for ADLs. [place] "6-clicks" Daily Activity Inpatient Short Form score of 19/24 this session. Patient lives in a [place] living apartment at the [facility] with PRN supervision and assistance from family.  Patient demonstrates good rehab potential, and should benefit from continued skilled occupational therapy services while in acute care to maximize safety, independence and quality of life at home.  Continued occupational therapy services in a SNF setting prior to return home is recommended.  ?    Follow Up Recommendations  SNF    Equipment Recommendations   (defer to post-acute recommendations.)    Recommendations for Other Services       Precautions / Restrictions  Precautions Precautions: Fall Restrictions Weight Bearing Restrictions: No      Mobility Bed Mobility Overal bed mobility: Needs Assistance Bed Mobility: Supine to Sit;Sit to Supine     Supine to sit: Supervision Sit to supine: Min assist   General bed mobility comments: Min Assist to fully raise LEs onto stretcher in ED.    Transfers Overall transfer level: Needs assistance   Transfers: Sit to/from Stand Sit to Stand: Min assist              Balance Overall balance assessment: History of Falls;Needs assistance Sitting-balance support: Feet unsupported Sitting balance-Leahy Scale: Good   Postural control: Posterior lean Standing balance support: Bilateral upper extremity supported Standing balance-Leahy Scale: Poor                             ADL either performed or assessed with clinical judgement   ADL Overall ADL's : Needs assistance/impaired Eating/Feeding: Independent   Grooming: Wash/dry hands;Set up;Sitting   Upper Body Bathing: Sitting;Supervision/ safety;Set up   Lower Body Bathing: Minimal assistance;Sit to/from stand;Sitting/lateral leans   Upper Body Dressing : Set up;Supervision/safety;Sitting   Lower Body Dressing: Minimal assistance;Sitting/lateral leans;Sit to/from stand   Toilet Transfer: RW;Minimal assistance   Toileting- Water quality scientist and Hygiene: Minimal assistance;Sit to/from stand;Sitting/lateral lean Toileting - Clothing Manipulation Details (indicate cue type and reason): Pure wick and wearing diaper brief.     Functional mobility during ADLs: Min guard;Minimal assistance;Rolling walker       Vision   Vision Assessment?: No apparent visual deficits     Perception     Praxis      Pertinent Vitals/Pain Pain Assessment:  No/denies pain     Hand Dominance Right   Extremity/Trunk Assessment Upper Extremity Assessment Upper Extremity Assessment:  (DIL reports that pt's hands "go numb"  intermittently.)   Lower Extremity Assessment Lower Extremity Assessment:  (Pt reports BLE numbness)   Cervical / Trunk Assessment Cervical / Trunk Assessment: Kyphotic   Communication Communication Communication: No difficulties   Cognition Arousal/Alertness: Awake/alert Behavior During Therapy: WFL for tasks assessed/performed Overall Cognitive Status: History of cognitive impairments - at baseline                                 General Comments: Pt is pleasant, alert and oriented to person, place and situation. Not to month or year. DIL assisted with recent history. Pt also very HOH with a left hearing aid.   General Comments       Exercises     Shoulder Instructions      Home Living Family/patient expects to be discharged to:: Skilled nursing facility Living Arrangements: Alone   Type of Home: Apartment ([address] Apartments at [employer] Home Access: Ramped entrance;Elevator     Home Layout: One level     Bathroom Shower/Tub: Occupational psychologist: Handicapped height     Home Equipment: Environmental consultant - 4 wheels          Prior Functioning/Environment Level of Independence: Independent with assistive device(s)        Comments: Pt uses her Rollator at all times. DIL endorses one other fall this year when pt forgot to lock brakes on her Rollator prior to sitting. Pt receives private pay housekeeping services and DIL or [place] provides transportation. Per DIL, pt engages in many of the planned activities at the [facility] and stays active.  Per DIL, pt has had a recent, gradual decline in her ablity to perform ADLs.        OT Problem List: Decreased activity tolerance;Impaired balance (sitting and/or standing);Decreased strength;Decreased safety awareness;Decreased knowledge of use of DME or AE;Impaired sensation      OT Treatment/Interventions: Self-care/ADL training;Therapeutic exercise;Therapeutic activities;Cognitive  remediation/compensation;DME and/or AE instruction;Patient/family education;Balance training    OT Goals(Current goals can be found in the care plan section) Acute Rehab OT Goals Patient Stated Goal: Increase steadiness OT Goal Formulation: With patient/family Time For Goal Achievement: 01/15/21 Potential to Achieve Goals: Good ADL Goals Pt Will Perform Grooming: standing;with modified independence (3/3) Pt Will Perform Lower Body Bathing: with supervision;sitting/lateral leans;sit to/from stand Pt Will Perform Lower Body Dressing: sitting/lateral leans;sit to/from stand;with set-up Pt Will Transfer to Toilet: ambulating;with supervision Pt Will Perform Toileting - Clothing Manipulation and hygiene: with modified independence;sitting/lateral leans;sit to/from stand Pt/caregiver will Perform Home Exercise Program: Increased strength;Both right and left upper extremity;With Supervision  OT Frequency: Min 2X/week   Barriers to D/C:    Lives alone.       Co-evaluation PT/OT/SLP Co-Evaluation/Treatment: Yes Reason for Co-Treatment: To address functional/ADL transfers PT goals addressed during session: Mobility/safety with mobility OT goals addressed during session: ADL's and self-care      AM-PAC OT "6 Clicks" Daily Activity     Outcome Measure Help from another person eating meals?: None Help from another person taking care of personal grooming?: A Little Help from another person toileting, which includes using toliet, bedpan, or urinal?: A Little Help from another person bathing (including washing, rinsing, drying)?: A Little Help from another person to put on and taking off regular upper body clothing?: A  Little Help from another person to put on and taking off regular lower body clothing?: A Little 6 Click Score: 19   End of Session Equipment Utilized During Treatment: Gait belt;Rolling walker Nurse Communication: Mobility status  Activity Tolerance: Patient tolerated  treatment well Patient left: in bed;with family/visitor present (ED stretcher)  OT Visit Diagnosis: Unsteadiness on feet (R26.81)                Time: 1036-1101 OT Time Calculation (min): 25 min Charges:  OT General Charges $OT Visit: 1 Visit OT Evaluation $OT Eval Low Complexity: [place], [place] Office: [phone] 01/01/2021 Julien Girt 01/01/2021, 11:23 AM

## 2021-01-02 DIAGNOSIS — D72829 Elevated white blood cell count, unspecified: Secondary | ICD-10-CM | POA: Diagnosis not present

## 2021-01-02 DIAGNOSIS — E860 Dehydration: Secondary | ICD-10-CM | POA: Diagnosis not present

## 2021-01-02 DIAGNOSIS — R338 Other retention of urine: Secondary | ICD-10-CM

## 2021-01-02 DIAGNOSIS — N179 Acute kidney failure, unspecified: Secondary | ICD-10-CM | POA: Diagnosis not present

## 2021-01-02 LAB — COMPREHENSIVE METABOLIC PANEL
ALT: 20 U/L (ref 0–44)
AST: 35 U/L (ref 15–41)
Albumin: 2.8 g/dL — ABNORMAL LOW (ref 3.5–5.0)
Alkaline Phosphatase: 54 U/L (ref 38–126)
Anion gap: 4 — ABNORMAL LOW (ref 5–15)
BUN: 32 mg/dL — ABNORMAL HIGH (ref 8–23)
CO2: 22 mmol/L (ref 22–32)
Calcium: 8.5 mg/dL — ABNORMAL LOW (ref 8.9–10.3)
Chloride: 109 mmol/L (ref 98–111)
Creatinine, Ser: 1.11 mg/dL — ABNORMAL HIGH (ref 0.44–1.00)
GFR, Estimated: 44 mL/min — ABNORMAL LOW (ref >60)
Glucose, Bld: 93 mg/dL (ref 70–99)
Potassium: 4 mmol/L (ref 3.5–5.1)
Sodium: 135 mmol/L (ref 135–145)
Total Bilirubin: 0.5 mg/dL (ref 0.3–1.2)
Total Protein: 5.5 g/dL — ABNORMAL LOW (ref 6.5–8.1)

## 2021-01-02 LAB — CBC
HCT: 36.7 % (ref 36.0–46.0)
Hemoglobin: 11.6 g/dL — ABNORMAL LOW (ref 12.0–15.0)
MCH: 28.4 pg (ref 26.0–34.0)
MCHC: 31.6 g/dL (ref 30.0–36.0)
MCV: 90 fL (ref 80.0–100.0)
Platelets: 212 10*3/uL (ref 150–400)
RBC: 4.08 MIL/uL (ref 3.87–5.11)
RDW: 17.1 % — ABNORMAL HIGH (ref 11.5–15.5)
WBC: 11.5 10*3/uL — ABNORMAL HIGH (ref 4.0–10.5)
nRBC: 0 % (ref 0.0–0.2)

## 2021-01-02 MED ORDER — TAMSULOSIN HCL 0.4 MG PO CAPS
0.4000 mg | ORAL_CAPSULE | Freq: Every day | ORAL | Status: DC
  Administered 2021-01-02 – 2021-01-03 (×2): 0.4 mg via ORAL
  Filled 2021-01-02 (×2): qty 1

## 2021-01-02 MED ORDER — ORAL CARE MOUTH RINSE
15.0000 mL | Freq: Two times a day (BID) | OROMUCOSAL | Status: DC
  Administered 2021-01-02 – 2021-01-03 (×3): 15 mL via OROMUCOSAL

## 2021-01-02 NOTE — Progress Notes (Signed)
  Speech Language Pathology Treatment: Dysphagia  Patient Details Name: CADINCE HILSCHER MRN: [id number] DOB: 05-17-1918 Today's Date: 01/02/2021 Time: 1235-1300 SLP Time Calculation (min) (ACUTE ONLY): 25 min  Assessment / Plan / Recommendation Clinical Impression  Pt observed with her lunch time meal as foam build ups provided to SLP per SLP request.   OT had signed off their service and advised SNF follow up.   Pt advised that she did find the brown build up easier to use than the plain utensils- therefore provided her with a 2nd build up. Wrote pt's name on her foam build ups and requested daughter in law remove when pt finished eating to reuse on her utensils.   Daughter in law observed SLP place foam on utensils and reported understanding to its use.     Pt fed herself pot roast, mashed potatoes, greens with no evidence of airway compromise - no coughing,throat clearing or eructation.  She benefited from minimal cue to take small boluses and SLP used written and verbal cues to remind her.     She would frequently initiate talking while masticating - but would self induce pause and hold her finger to her mouth as cue to herself to swallow first approximately 90% of opportunities.  Twice during meal she required reminder.    Suspect SLP presence in pt's room increases her desire to communicate and she will perform better with only family present or alone.  At this time, pt is aware of behavior modification advised to maximize her airway protection.    Recommend continue dys3/thin (due to missing upper partial) and follow up briefly at SNF to assure managing and following precautions to optimal ability.    HPI HPI: 87 y.o. female with medical history significant of HTN, prior bladder tumor, CKD 3a.  Patient presented to the emergency department after being found on the floor by her daughter-in-law.  She seemed a bit confused.  Patient was found to have acute renal failure.  According to  daughter-in-law patient has had poor oral intake the last few weeks.  Pt is on a PPI and swallow evaluation ordered - daughter in law reports pt coughing with po this am - pt denies this until after made aware.      SLP Plan  All goals met       Recommendations  Diet recommendations: Dysphagia 3 (mechanical soft);Thin liquid Liquids provided via: Cup;No straw Medication Administration: Whole meds with liquid Supervision: Patient able to self feed Compensations: Small sips/bites;Slow rate (liquids t/o meal) Postural Changes and/or Swallow Maneuvers: Seated upright 90 degrees;Upright 30-60 min after meal                Oral Care Recommendations: Oral care BID SLP Visit Diagnosis: Dysphagia, unspecified (R13.10) Plan: All goals met       GO               Kathleen Lime, MS [facility] SLP Acute Rehab Services Office [phone] Pager [phone]  Macario Golds 01/02/2021, 1:32 PM

## 2021-01-02 NOTE — Progress Notes (Addendum)
[facility] PROGRESS NOTE   Karen [name] [id number] DOB: 08-04-1917 DOA: 12/31/2020  PCP: Leeroy Cha, MD  Brief History/Interval Summary: 87 y.o. female with medical history significant of HTN, prior bladder tumor, CKD 3a.  Patient presented to the emergency department after being found on the floor by her daughter-in-law.  She seemed a bit confused.  Patient was found to have acute renal failure.  According to daughter-in-law patient has had poor oral intake the last few weeks.  Consultants: None  Procedures: None  Antibiotics: Anti-infectives (From admission, onward)    None       Subjective/Interval History: Patient is very hard of hearing.  Her daughter-in-law is at the bedside.  Denies any chest pain shortness of breath.  Feels better this morning.      Assessment/Plan:  Acute kidney injury on chronic kidney disease stage IIIa/hyponatremia Based on labs from 2018 her creatinine at that time was 0.9.  Presented with creatinine of 2.6.  Patient was noted to be dehydrated.  Renal ultrasound does not show any hydronephrosis.  CT scan did not show any significant abnormalities.  Renal atrophy was noted on imaging studies.  Patient's daughter-in-law mentioned that patient has been having poor oral intake for the past few weeks.  She also felt that the patient has been slowly declining over the past many months.  No GI illness has been present recently.   Patient was started on IV fluids with improvement in renal function.  Creatinine is now down to 1.1 from a peak of 2.6  She was also noted to have urinary retention yesterday.  See below.  Seems to have voided on her own this morning.    Acute urinary retention Was noted to have distended bladder on examination.  In and out catheterization was done yesterday.  Seems to have voided on her home this morning.  Start mobilizing.  Initiate Flomax.  Continue to do bladder scans every shift for now.   UA did not  suggest infection. Patient does have a history of bladder cancer but no blood was noted either.  Imaging studies did not show any significant abnormalities involving the urinary bladder.  Concern for oropharyngeal dysphagia Patient noted to have coughing episodes while eating her breakfast this morning.  Speech therapy has been consulted.  Changed diet to dysphagia 3 for now  Elevated lipase Abdomen is otherwise benign.  Elevated lipase could be due to acute renal failure.  Continue to monitor clinically for now.  Leukocytosis Most likely reactive.  No obvious source of infection has been found.  She is afebrile.  WBC is better today.  Mildly elevated troponin Clinical significance unclear as patient denies any chest pain.  Normal anion gap metabolic acidosis Likely due to hypovolemia.  Now improved.  Physical deconditioning Patient has experienced deconditioning over the past few weeks according to family.  Seen by PT and OT and SNF is recommended.  Transition of care consulted.    DVT Prophylaxis: SCDs Code Status: DNR Family Communication: Discussed with patient's daughter-in-law at bedside Disposition Plan: SNF in 65 to 48 hours.  Status is: Inpatient  Remains inpatient appropriate because:IV treatments appropriate due to intensity of illness or inability to take PO and Inpatient level of care appropriate due to severity of illness  Dispo:  Patient From: Home  Planned Disposition: SNF  Medically stable for discharge: No           Medications: Scheduled:  mouth rinse  15 mL Mouth Rinse BID  Continuous:  lactated ringers 75 mL/hr at 01/02/21 0200   [id number] **OR** acetaminophen, ondansetron **OR** ondansetron (ZOFRAN) IV, polyvinyl alcohol   Objective:  Vital Signs  Vitals:   01/01/21 1422 01/01/21 1750 01/01/21 2225 01/02/21 0233  BP: (!) 118/56 (!) 135/56 125/66 137/64  Pulse: 86 88 87 87  Resp: '17 19 16 16  '$ Temp: 99.2 F (37.3 C) 98.1 F  (36.7 C) 99 F (37.2 C) 97.7 F (36.5 C)  TempSrc: Oral Oral Oral Oral  SpO2: 95% 96% 96% 93%  Weight:      Height:        Intake/Output Summary (Last 24 hours) at 01/02/2021 1133 Last data filed at 01/02/2021 0527 Gross per 24 hour  Intake 2318.79 ml  Output 500 ml  Net 1818.79 ml    Filed Weights   12/31/20 1609  Weight: 61 kg    General appearance: Awake alert.  In no distress.  Mildly distracted Resp: Clear to auscultation bilaterally.  Normal effort Cardio: S1-S2 is normal regular.  No S3-S4.  No rubs murmurs or bruit GI: Abdomen is soft.  Not distended as yesterday.  No masses or organomegaly appreciated.  Bowel sounds present.   Extremities: No edema.  Full range of motion of lower extremities. Neurologic: No focal neurological deficits.     Lab Results:  Data Reviewed: I have personally reviewed following labs and imaging studies  CBC: Recent Labs  Lab 12/31/20 1701 01/01/21 0301 01/02/21 0603  WBC 20.9* 18.1* 11.5*  NEUTROABS 18.2*  --   --   HGB 12.8 12.0 11.6*  HCT 40.6 37.8 36.7  MCV 89.4 90.2 90.0  PLT 308 275 212     Basic Metabolic Panel: Recent Labs  Lab 12/31/20 1701 01/01/21 0301 01/02/21 0603  NA 134* 134* 135  K 4.1 4.3 4.0  CL 101 104 109  CO2 18* 20* 22  GLUCOSE 126* 97 93  BUN 36* 34* 32*  CREATININE 2.60* 1.95* 1.11*  CALCIUM 9.4 8.7* 8.5*     GFR: Estimated Creatinine Clearance: 22.7 mL/min (A) (by C-G formula based on SCr of 1.11 mg/dL (H)).  Liver Function Tests: Recent Labs  Lab 12/31/20 1701 01/01/21 0301 01/02/21 0603  AST 37 31 35  ALT '21 18 20  '$ ALKPHOS 77 61 54  BILITOT 0.9 0.7 0.5  PROT 7.5 6.0* 5.5*  ALBUMIN 4.0 3.2* 2.8*     Recent Labs  Lab 12/31/20 1701  LIPASE 205*      Cardiac Enzymes: Recent Labs  Lab 12/31/20 1701  CKTOTAL 357*      Radiology Studies: CT ABDOMEN PELVIS WO CONTRAST  Result Date: 12/31/2020 CLINICAL DATA:  Nausea, vomiting, elevated lipase and acute kidney  injury EXAM: CT ABDOMEN AND PELVIS WITHOUT CONTRAST TECHNIQUE: Multidetector CT imaging of the abdomen and pelvis was performed following the standard protocol without IV contrast. COMPARISON:  CT 01/24/2012 FINDINGS: Lower chest: Atelectatic changes in the lung bases. Normal cardiac size. Coronary artery calcifications. Trace pericardial fluid. Hepatobiliary: Stable benign appearing punctate calcifications in the hepatic dome and left lobe. No concerning focal liver lesion. Smooth liver surface contour. Normal hepatic attenuation. No visible calcified gallstones or biliary ductal dilatation. No gross abnormality of the gallbladder though evaluation limited by upper abdominal motion artifact. Pancreas: Mild pancreatic atrophy. No pancreatic ductal dilatation or surrounding inflammatory changes. Spleen: Normal in size. No concerning splenic lesions. Adrenals/Urinary Tract: Normal adrenals. Bilateral renal atrophy. No concerning renal mass. No urolithiasis or hydronephrosis. Bladder is unremarkable for the degree of  distention. Stomach/Bowel: Distal esophagus, stomach and duodenum are free of acute or worrisome abnormality. Normal duodenal sweep across the abdomen. No worrisome large or small bowel thickening or dilatation. Appendix is not visualized. No focal inflammation the vicinity of the cecum to suggest an occult appendicitis. Distal colonic diverticulosis without focal inflammation to suggest acute diverticulitis. Vascular/Lymphatic: Limited evaluation the absence of contrast. Extensive atherosclerotic calcifications throughout the aorta and branch vessels without worrisome aneurysm or ectasia. No pathologically enlarged nodes. Reproductive: Uterus is surgically absent. No concerning adnexal lesions. Surgical changes along the bilateral pelvic sidewalls. Other: No abdominopelvic free air or fluid. No bowel containing hernia. Soft tissue thickening superficial to the posterosuperior iliac spines and bilateral  iliac crests, as well as several of the spinous processes. Correlate with visual inspection to exclude developing decubitus ulceration. Musculoskeletal: Interval development of a vertebra plana deformity at the L1 level which appears overall chronic in nature. Some focal dextrocurvature noted at this level as well. Additional remote appearing superior endplate deformity L3 and minimal anterior wedging T12. No clear acute fracture or traumatic osseous injury is seen. IMPRESSION: No clear acute CT abnormality to provide cause for patient's symptoms. Specifically, fairly unremarkable appearance of the pancreas and biliary tree accounting for motion artifact. If there is persisting clinical concern, right upper quadrant ultrasound could be obtained. Remote appearing compression deformities including vertebral plana at L1, increasing superior endplate convexity L3 and anterior wedging T12. Coronary artery atherosclerosis. Aortic Atherosclerosis (ICD10-I70.0). Electronically Signed   By: Lovena Le M.D.   On: 12/31/2020 19:24   CT Head Wo Contrast  Result Date: 12/31/2020 CLINICAL DATA:  Nausea and dry heaving. [place] out of bed this morning. EXAM: CT HEAD WITHOUT CONTRAST TECHNIQUE: Contiguous axial images were obtained from the base of the skull through the vertex without intravenous contrast. COMPARISON:  None. FINDINGS: Brain: No evidence of acute infarction, hemorrhage, hydrocephalus, extra-axial collection or mass lesion/mass effect. There is mild ventricular sulcal enlargement reflecting age-appropriate volume loss. Patchy areas of white matter hypoattenuation are noted bilaterally consistent with mild to moderate chronic microvascular ischemic change. Vascular: No hyperdense vessel or unexpected calcification. Skull: Normal. Negative for fracture or focal lesion. Sinuses/Orbits: Globes and orbits are unremarkable. Visualized sinuses are clear. Other: None. IMPRESSION: 1. No acute intracranial abnormalities. 2.  Age-appropriate volume loss and mild to moderate chronic microvascular ischemic change. Electronically Signed   By: Lajean Manes M.D.   On: 12/31/2020 17:32   US RENAL  Result Date: 12/31/2020 CLINICAL DATA:  Acute kidney injury. Trans urethral resection of bladder tumor. Hypertension. EXAM: RENAL / URINARY TRACT ULTRASOUND COMPLETE COMPARISON:  CT abdomen and pelvis 12/31/2020 FINDINGS: Right Kidney: Renal measurements: 7.9 x 3.7 x 4.6 cm = volume: 70 mL. Diffuse parenchymal atrophy with increased echotexture of the renal parenchyma suggesting chronic medical renal disease. No hydronephrosis. Left Kidney: Renal measurements: 7.7 x 4.1 x 4.5 cm = volume: 44 mL. Diffuse parenchymal atrophy with increased echotexture of the parenchyma suggesting chronic medical renal disease. No hydronephrosis. Bladder: Appears normal for degree of bladder distention. Other: None. IMPRESSION: Bilateral renal parenchymal atrophy. No focal lesion. No hydronephrosis. Electronically Signed   By: Lucienne Capers M.D.   On: 12/31/2020 21:41   DG Chest Port 1 View  Result Date: 12/31/2020 CLINICAL DATA:  fall, weakness EXAM: PORTABLE CHEST 1 VIEW COMPARISON:  May 12, 2017 FINDINGS: Evaluation is limited secondary to patient rotation. The cardiomediastinal silhouette is unchanged in contour. No pleural effusion. No pneumothorax. No acute pleuroparenchymal abnormality. Visualized abdomen  is unremarkable. Multilevel degenerative changes of the thoracic spine. Skin fold overlying the mid mediastinum. IMPRESSION: No acute cardiopulmonary abnormality. Electronically Signed   By: Valentino Saxon MD   On: 12/31/2020 17:27       LOS: 1 day   Bonnielee Haff  [facility] Pager on [email or web address]  01/02/2021, 11:33 AM

## 2021-01-02 NOTE — NC FL2 (Signed)
[place] LEVEL OF CARE SCREENING TOOL     IDENTIFICATION  Patient Name: Karen [name] Birthdate: 10/25/1917 Sex: female Admission Date (Current Location): 12/31/2020  [facility] and [place] Number:  Herbalist and Address:  [facility],  [address], [place]      Provider Number: [id number]  Attending Physician Name and Address:  Bonnielee Haff, MD  Relative Name and Phone Number:       Current Level of Care: Hospital Recommended Level of Care: [place] Prior Approval Number:    Date Approved/Denied:   PASRR Number: [id number]  Discharge Plan: SNF    Current Diagnoses: Patient Active Problem List   Diagnosis Date Noted   AKI (acute kidney injury) ([place]) 12/31/2020   Leukocytosis 12/31/2020   Elevated lipase Q000111Q   Metabolic acidosis, NAG, failure of bicarbonate regeneration 12/31/2020   Chronic kidney disease, stage 3a ([place]) 12/31/2020   Carotid stenosis 03/29/2014   Aftercare following surgery of the circulatory system 03/29/2014   Aftercare following surgery of the circulatory system, NEC 02/23/2013   Occlusion and stenosis of carotid artery without mention of cerebral infarction 02/18/2012    Orientation RESPIRATION BLADDER Height & Weight     Self, Place, Time, Situation  Normal Continent Weight: 61 kg Height:  '5\' 4"'$  (162.6 cm)  BEHAVIORAL SYMPTOMS/MOOD NEUROLOGICAL BOWEL NUTRITION STATUS      Continent Diet (Mechanical Soft)  AMBULATORY STATUS COMMUNICATION OF NEEDS Skin   Limited Assist Verbally Skin abrasions, Bruising                       Personal Care Assistance Level of Assistance  Bathing, Dressing, Feeding Bathing Assistance: Limited assistance Feeding assistance: Independent Dressing Assistance: Limited assistance     Functional Limitations Info  Sight, Hearing Sight Info: Impaired Hearing Info: Impaired      SPECIAL CARE FACTORS FREQUENCY  PT (By licensed PT),  OT (By licensed OT)     PT Frequency: 5 x weekly OT Frequency: 5 x weekly            Contractures Contractures Info: Not present    Additional Factors Info  Code Status, Allergies Code Status Info: DNR Allergies Info: Lisinopril, Celebrex           Current Medications (01/02/2021):  This is the current hospital active medication list Current Facility-Administered Medications  Medication Dose Route Frequency Provider Last Rate Last Admin   acetaminophen (TYLENOL) tablet 650 mg  650 mg Oral Q6H PRN Etta Quill, DO       Or   acetaminophen (TYLENOL) suppository 650 mg  650 mg Rectal Q6H PRN Etta Quill, DO       lactated ringers infusion   Intravenous Continuous Bonnielee Haff, MD 30 mL/hr at 01/02/21 1300 Rate Change at 01/02/21 1300   MEDLINE mouth rinse  15 mL Mouth Rinse BID Bonnielee Haff, MD       ondansetron [facility]) tablet 4 mg  4 mg Oral Q6H PRN Etta Quill, DO       Or   ondansetron [facility]) injection 4 mg  4 mg Intravenous Q6H PRN Etta Quill, DO       polyvinyl alcohol (LIQUIFILM TEARS) 1.4 % ophthalmic solution 2 drop  2 drop Both Eyes PRN Bonnielee Haff, MD       tamsulosin (FLOMAX) capsule 0.4 mg  0.4 mg Oral Daily Bonnielee Haff, MD  Discharge Medications: Please see discharge summary for a list of discharge medications.  Relevant Imaging Results:  Relevant Lab Results:   Additional Information SS# [id number]  Isair Inabinet, Marjie Skiff, RN

## 2021-01-02 NOTE — Progress Notes (Signed)
Pt. Was bladder scanned with 625 ml showing, in & out cath output of 900 ml.

## 2021-01-02 NOTE — TOC Initial Note (Signed)
Transition of Care [facility]) - Initial/Assessment Note    Patient Details  Name: Karen [name] MRN: [id number] Date of Birth: Jan 04, 1918  Transition of Care [facility]) CM/SW Contact:    Shealyn Sean, Marjie Skiff, RN Phone Number: 01/02/2021, 2:40 PM  Clinical Narrative:                 Spoke with pt and daughter [name] at the bedside for DC planning. They both agree for pt to go to SNF for rehab at dc. She is from [facility] independent [facility] . FL2 faxed out to area SNF facilities. SNF bed offers with be presented once available.  Expected Discharge Plan: Skilled Nursing Facility Barriers to Discharge: Continued Medical Work up   Patient Goals and CMS Choice Patient states their goals for this hospitalization and ongoing recovery are:: To go home      Expected Discharge Plan and Services Expected Discharge Plan: [place]   Discharge Planning Services: CM Consult   Living arrangements for the past 2 months: Apartment                  Prior Living Arrangements/Services Living arrangements for the past 2 months: Apartment Lives with:: Self Patient language and need for interpreter reviewed:: Yes        Need for Family Participation in Patient Care: Yes (Comment) Care giver support system in place?: Yes (comment)   Criminal Activity/Legal Involvement Pertinent to Current Situation/Hospitalization: No - Comment as needed  Activities of Daily Living Home Assistive Devices/Equipment: Environmental consultant (specify type), Bedside commode/3-in-1, Shower chair with back (bilateral hearing aides, reading glasses) ADL Screening (condition at time of admission) Patient's cognitive ability adequate to safely complete daily activities?: No Is the patient deaf or have difficulty hearing?: Yes (wears bilateral hearing aide) Does the patient have difficulty seeing, even when wearing glasses/contacts?: Yes Does the patient have difficulty concentrating, remembering, or making decisions?: Yes Patient able to  express need for assistance with ADLs?: Yes Does the patient have difficulty dressing or bathing?: Yes Independently performs ADLs?: No Communication: Independent Dressing (OT): Needs assistance Is this a change from baseline?: Pre-admission baseline Grooming: Independent Feeding: Needs assistance Is this a change from baseline?: Pre-admission baseline Bathing: Needs assistance Is this a change from baseline?: Pre-admission baseline Toileting: Needs assistance Is this a change from baseline?: Pre-admission baseline In/Out Bed: Needs assistance Is this a change from baseline?: Pre-admission baseline Walks in Home: Needs assistance Is this a change from baseline?: Pre-admission baseline Does the patient have difficulty walking or climbing stairs?: Yes Weakness of Legs: Both Weakness of Arms/Hands: Both  Permission Sought/Granted                  Emotional Assessment Appearance:: Appears stated age Attitude/Demeanor/Rapport: Gracious Affect (typically observed): Calm Orientation: : Oriented to Self, Oriented to Place, Oriented to  Time, Oriented to Situation Alcohol / Substance Use: Not Applicable    Admission diagnosis:  Dehydration [E86.0] AKI (acute kidney injury) ([place]) [N17.9] Elevated lipase [R74.8] Patient Active Problem List   Diagnosis Date Noted   AKI (acute kidney injury) ([place]) 12/31/2020   Leukocytosis 12/31/2020   Elevated lipase Q000111Q   Metabolic acidosis, NAG, failure of bicarbonate regeneration 12/31/2020   Chronic kidney disease, stage 3a ([place]) 12/31/2020   Carotid stenosis 03/29/2014   Aftercare following surgery of the circulatory system 03/29/2014   Aftercare following surgery of the circulatory system, NEC 02/23/2013   Occlusion and stenosis of carotid artery without mention of cerebral infarction 02/18/2012  PCP:  Leeroy Cha, MD Pharmacy:   [facility], [address] [place] [address] Phone: [phone] Fax: [phone]     Social Determinants of Health (SDOH) Interventions    Readmission Risk Interventions No flowsheet data found.

## 2021-01-02 NOTE — Evaluation (Signed)
Clinical/Bedside Swallow Evaluation Patient Details  Name: LOGHAN MARICONDA MRN: [id number] Date of Birth: 12-Mar-1918  Today's Date: 01/02/2021 Time: SLP Start Time (ACUTE ONLY): 44 SLP Stop Time (ACUTE ONLY): 1115 SLP Time Calculation (min) (ACUTE ONLY): 60 min  Past Medical History:  Past Medical History:  Diagnosis Date   Anxiety    Arthritis knee   Bladder tumor    Depression    Dyspnea on exertion    GERD (gastroesophageal reflux disease)    Hearing loss BILATERAL AIDS   Hematuria    History of endometrial cancer S/P HYSTERECTOMY  2001   Hyperlipidemia    Hypertension CARDIOLOGIST-- DR Irish Lack-  LOV  MARCH 2013--  REQUESTED NOTE AND EKG   Occlusion and stenosis of carotid artery without mention of cerebral infarction FOLLOWED BY DR Kellie Simmering--  LEFT ICA  0=19%  AND RIGHT ICA  20-395   OCCASIONAL DIZZINESS   PONV (postoperative nausea and vomiting)    Past Surgical History:  Past Surgical History:  Procedure Laterality Date   CAROTID ENDARTERECTOMY  01-19-2010  DR LAWSON   Left CEA with Dacron patch   CATARACT EXTRACTION W/ INTRAOCULAR LENS  IMPLANT, BILATERAL     TRANSTHORACIC ECHOCARDIOGRAM  11-24-2009  DR VARANASI   LVEF  65-70%/  MILD MITRAL, ATRIAL AND TRICUSID REGURG.   TRANSURETHRAL RESECTION OF BLADDER TUMOR  03/16/2012   Procedure: TRANSURETHRAL RESECTION OF BLADDER TUMOR (TURBT);  Surgeon: Claybon Jabs, MD;  Location: [facility];  Service: Urology;  Laterality: N/A;  1 hour requested for this case  CAMERA GYRUS   VAGINAL HYSTERECTOMY  2001   W/  BILATERAL SALPINGO-OOPHECTOMY FOR ENDOMETRIAL CANCER   HPI:  87 y.o. female with medical history significant of HTN, prior bladder tumor, CKD 3a.  Patient presented to the emergency department after being found on the floor by her daughter-in-law.  She seemed a bit confused.  Patient was found to have acute renal failure.  According to daughter-in-law patient has had poor oral intake the last few weeks.   Pt is on a PPI and swallow evaluation ordered - daughter in law reports pt coughing with po this am - pt denies this until after made aware.   Assessment / Plan / Recommendation Clinical Impression  Pt with negative CN exam and strong phonation ability.   No overt indication of aspiration or airway infiltration observed with her consuming a full snack and she passed the [place] 3 ounce water challenge.  She does admit to occasional issues coughing with liquids more than solids. Pt observed to take large boluses and eat at rapid rate.  For airway protection, advised her to take small bites, eat slowly and drink liquids t/o meal.  Pt states she had brothers growing up and had to eat quickly but she will make attempts to eat slowly.  Pt's kyphotic which likely impacts esophageal clearance.  In addition, she is on a PPI and was observed to belch several times (at least x10) following snack. She denies this eructation to be productive .- stating it's dry.  Advised against using straws at this time.  Daughter in law, endorses pt belching PTA but currently more pronouned.  Suspect component of presbyesophagus given her advanced age.  Will follow up x1 to assure po tolerance and endorse behavioral modification to maximize pt's comfort and airway protection with po intake.  Posted signs in room and provided pt with separate compensation sign. Pt was repetitive during session - which daughter in law states is  more pronounced than baseline. SLP Visit Diagnosis: Dysphagia, unspecified (R13.10)    Aspiration Risk  Mild aspiration risk    Diet Recommendation Dysphagia 3 (Mech soft);Thin liquid   Liquid Administration via: Cup;No straw Medication Administration: Whole meds with liquid Supervision: Patient able to self feed;Comment (set up assist) Compensations: Small sips/bites;Slow rate (drink liquids t/o meals) Postural Changes: Seated upright at 90 degrees;Remain upright for at least 30 minutes after po intake     Other  Recommendations Oral Care Recommendations: Oral care BID   Follow up Recommendations    TBD    Frequency and Duration min 1 x/week  1 week       Prognosis Prognosis for Safe Diet Advancement: Fair      Swallow Study   General Date of Onset: 01/02/21 HPI: 87 y.o. female with medical history significant of HTN, prior bladder tumor, CKD 3a.  Patient presented to the emergency department after being found on the floor by her daughter-in-law.  She seemed a bit confused.  Patient was found to have acute renal failure.  According to daughter-in-law patient has had poor oral intake the last few weeks.  Pt is on a PPI and swallow evaluation ordered - daughter in law reports pt coughing with po this am - pt denies this until after made aware. Type of Study: Bedside Swallow Evaluation Diet Prior to this Study: Dysphagia 3 (soft);Thin liquids Temperature Spikes Noted: No Respiratory Status: Room air History of Recent Intubation: No Behavior/Cognition: Alert Oral Cavity Assessment: Within Functional Limits Oral Care Completed by SLP: No Oral Cavity - Dentition: Other (Comment);Dentures, bottom (upper partial left at facility) Vision: Impaired for self-feeding (asked OT to see if pt qualifies for build up for untensils) Self-Feeding Abilities: Needs set up Patient Positioning: Upright in bed Baseline Vocal Quality: Normal Volitional Cough: Strong    Oral/Motor/Sensory Function Overall Oral Motor/Sensory Function: Within functional limits   Ice Chips Ice chips: Not tested   Thin Liquid Thin Liquid: Impaired Presentation: Cup;Straw;Self Fed Pharyngeal  Phase Impairments: Throat Clearing - Immediate Other Comments: Pt passed 3 ounce [place] water challenge however furhter into po intake, she was observed to clear her throat frequently - Daughter in law endorses this occuring PTA    Nectar Thick Nectar Thick Liquid: Within functional limits (slightly thicker orange juice) Presentation:  Straw;Self Fed Other Comments: sightly-thicker self-fed orange juice   Honey Thick Honey Thick Liquid: Not tested   Puree Puree: Within functional limits Presentation: Self Fed;Spoon   Solid     Solid: Within functional limits Presentation: Self Fed Other Comments: Pt takes large boluses and eats at rapid rate - Advised her to take small bites, eat slowly and drink liquids t/o meal.  Pt states she had brothers growing up and had to eat quickly.      Macario Golds 01/02/2021,11:33 AM  Kathleen Lime, MS [facility] SLP [place] Office [phone] Pager [phone]

## 2021-01-03 DIAGNOSIS — R338 Other retention of urine: Secondary | ICD-10-CM | POA: Diagnosis not present

## 2021-01-03 DIAGNOSIS — N1831 Chronic kidney disease, stage 3a: Secondary | ICD-10-CM | POA: Diagnosis not present

## 2021-01-03 DIAGNOSIS — M19041 Primary osteoarthritis, right hand: Secondary | ICD-10-CM | POA: Diagnosis not present

## 2021-01-03 DIAGNOSIS — M150 Primary generalized (osteo)arthritis: Secondary | ICD-10-CM | POA: Diagnosis not present

## 2021-01-03 DIAGNOSIS — R410 Disorientation, unspecified: Secondary | ICD-10-CM | POA: Diagnosis not present

## 2021-01-03 DIAGNOSIS — R54 Age-related physical debility: Secondary | ICD-10-CM | POA: Diagnosis not present

## 2021-01-03 DIAGNOSIS — I739 Peripheral vascular disease, unspecified: Secondary | ICD-10-CM | POA: Diagnosis not present

## 2021-01-03 DIAGNOSIS — R29898 Other symptoms and signs involving the musculoskeletal system: Secondary | ICD-10-CM | POA: Diagnosis not present

## 2021-01-03 DIAGNOSIS — R1312 Dysphagia, oropharyngeal phase: Secondary | ICD-10-CM | POA: Diagnosis not present

## 2021-01-03 DIAGNOSIS — N179 Acute kidney failure, unspecified: Secondary | ICD-10-CM | POA: Diagnosis not present

## 2021-01-03 DIAGNOSIS — H9193 Unspecified hearing loss, bilateral: Secondary | ICD-10-CM | POA: Diagnosis not present

## 2021-01-03 DIAGNOSIS — M2011 Hallux valgus (acquired), right foot: Secondary | ICD-10-CM | POA: Diagnosis not present

## 2021-01-03 DIAGNOSIS — E782 Mixed hyperlipidemia: Secondary | ICD-10-CM | POA: Diagnosis not present

## 2021-01-03 DIAGNOSIS — I10 Essential (primary) hypertension: Secondary | ICD-10-CM | POA: Diagnosis not present

## 2021-01-03 DIAGNOSIS — R278 Other lack of coordination: Secondary | ICD-10-CM | POA: Diagnosis not present

## 2021-01-03 DIAGNOSIS — N178 Other acute kidney failure: Secondary | ICD-10-CM | POA: Diagnosis not present

## 2021-01-03 DIAGNOSIS — Z66 Do not resuscitate: Secondary | ICD-10-CM | POA: Diagnosis not present

## 2021-01-03 DIAGNOSIS — M19042 Primary osteoarthritis, left hand: Secondary | ICD-10-CM | POA: Diagnosis not present

## 2021-01-03 DIAGNOSIS — M6281 Muscle weakness (generalized): Secondary | ICD-10-CM | POA: Diagnosis not present

## 2021-01-03 DIAGNOSIS — E871 Hypo-osmolality and hyponatremia: Secondary | ICD-10-CM | POA: Diagnosis not present

## 2021-01-03 DIAGNOSIS — M2042 Other hammer toe(s) (acquired), left foot: Secondary | ICD-10-CM | POA: Diagnosis not present

## 2021-01-03 DIAGNOSIS — E860 Dehydration: Secondary | ICD-10-CM | POA: Diagnosis not present

## 2021-01-03 DIAGNOSIS — R2681 Unsteadiness on feet: Secondary | ICD-10-CM | POA: Diagnosis not present

## 2021-01-03 DIAGNOSIS — Z23 Encounter for immunization: Secondary | ICD-10-CM | POA: Diagnosis not present

## 2021-01-03 DIAGNOSIS — M2041 Other hammer toe(s) (acquired), right foot: Secondary | ICD-10-CM | POA: Diagnosis not present

## 2021-01-03 DIAGNOSIS — H918X3 Other specified hearing loss, bilateral: Secondary | ICD-10-CM | POA: Diagnosis not present

## 2021-01-03 DIAGNOSIS — K219 Gastro-esophageal reflux disease without esophagitis: Secondary | ICD-10-CM | POA: Diagnosis not present

## 2021-01-03 DIAGNOSIS — M79672 Pain in left foot: Secondary | ICD-10-CM | POA: Diagnosis not present

## 2021-01-03 DIAGNOSIS — M1991 Primary osteoarthritis, unspecified site: Secondary | ICD-10-CM | POA: Diagnosis not present

## 2021-01-03 DIAGNOSIS — Z7409 Other reduced mobility: Secondary | ICD-10-CM | POA: Diagnosis not present

## 2021-01-03 DIAGNOSIS — D72829 Elevated white blood cell count, unspecified: Secondary | ICD-10-CM | POA: Diagnosis not present

## 2021-01-03 DIAGNOSIS — Z7401 Bed confinement status: Secondary | ICD-10-CM | POA: Diagnosis not present

## 2021-01-03 DIAGNOSIS — B351 Tinea unguium: Secondary | ICD-10-CM | POA: Diagnosis not present

## 2021-01-03 DIAGNOSIS — E872 Acidosis: Secondary | ICD-10-CM | POA: Diagnosis not present

## 2021-01-03 DIAGNOSIS — M2012 Hallux valgus (acquired), left foot: Secondary | ICD-10-CM | POA: Diagnosis not present

## 2021-01-03 DIAGNOSIS — R41841 Cognitive communication deficit: Secondary | ICD-10-CM | POA: Diagnosis not present

## 2021-01-03 DIAGNOSIS — Z20822 Contact with and (suspected) exposure to covid-19: Secondary | ICD-10-CM | POA: Diagnosis not present

## 2021-01-03 LAB — COMPREHENSIVE METABOLIC PANEL
ALT: 24 U/L (ref 0–44)
AST: 34 U/L (ref 15–41)
Albumin: 2.9 g/dL — ABNORMAL LOW (ref 3.5–5.0)
Alkaline Phosphatase: 57 U/L (ref 38–126)
Anion gap: 9 (ref 5–15)
BUN: 22 mg/dL (ref 8–23)
CO2: 24 mmol/L (ref 22–32)
Calcium: 8.6 mg/dL — ABNORMAL LOW (ref 8.9–10.3)
Chloride: 105 mmol/L (ref 98–111)
Creatinine, Ser: 0.83 mg/dL (ref 0.44–1.00)
GFR, Estimated: >60 mL/min (ref >60)
Glucose, Bld: 109 mg/dL — ABNORMAL HIGH (ref 70–99)
Potassium: 4.1 mmol/L (ref 3.5–5.1)
Sodium: 138 mmol/L (ref 135–145)
Total Bilirubin: 0.5 mg/dL (ref 0.3–1.2)
Total Protein: 5.9 g/dL — ABNORMAL LOW (ref 6.5–8.1)

## 2021-01-03 LAB — CBC
HCT: 37.6 % (ref 36.0–46.0)
Hemoglobin: 11.7 g/dL — ABNORMAL LOW (ref 12.0–15.0)
MCH: 28.5 pg (ref 26.0–34.0)
MCHC: 31.1 g/dL (ref 30.0–36.0)
MCV: 91.5 fL (ref 80.0–100.0)
Platelets: 209 10*3/uL (ref 150–400)
RBC: 4.11 MIL/uL (ref 3.87–5.11)
RDW: 17.3 % — ABNORMAL HIGH (ref 11.5–15.5)
WBC: 10.4 10*3/uL (ref 4.0–10.5)
nRBC: 0 % (ref 0.0–0.2)

## 2021-01-03 MED ORDER — AMLODIPINE BESYLATE 5 MG PO TABS: 10.0000 mg | ORAL_TABLET | Freq: Every day | ORAL | Status: DC

## 2021-01-03 MED ORDER — TAMSULOSIN HCL 0.4 MG PO CAPS: 0.4000 mg | ORAL_CAPSULE | Freq: Every day | ORAL | Status: DC

## 2021-01-03 MED ORDER — COVID-19 MRNA VACC (MODERNA) 50 MCG/0.25ML IM SUSP
0.2500 mL | Freq: Once | INTRAMUSCULAR | Status: AC
  Administered 2021-01-03: 0.25 mL via INTRAMUSCULAR
  Filled 2021-01-03: qty 0.25

## 2021-01-03 NOTE — TOC Progression Note (Signed)
Transition of Care [facility]) - Progression Note    Patient Details  Name: Karen [name] MRN: [id number] Date of Birth: Mar 02, 1918  [facility] CM/SW Contact  Shanell Aden, Marjie Skiff, RN Phone Number: 01/03/2021, 12:29 PM  Clinical Narrative:     SNF bed offers provided to daughter [name]. [facility] was chosen. [facility] confirmed bed availability for today. Per [employer] pt would need a second covid booster shot to not have to be in quarantine. Daughter [name] informed and MD has ordered covid vaccine. Yellow DNR on chart for transport. [facility] will be contacted for transport. RN to call report to [phone].   Expected Discharge Plan: [place] Barriers to Discharge: Continued Medical Work up  Expected Discharge Plan and Services Expected Discharge Plan: [place]   Discharge Planning Services: CM Consult   Living arrangements for the past 2 months: Apartment                  Readmission Risk Interventions No flowsheet data found.

## 2021-01-03 NOTE — Discharge Summary (Signed)
Physician Discharge Summary  Karen [name] [id number] DOB: May 11, 1918 DOA: 12/31/2020  PCP: Leeroy Cha, MD  Admit date: 12/31/2020 Discharge date: 01/03/2021  Admitted From: Home Disposition: [facility]    Recommendations for Outpatient Follow-up:  Follow up with PCP in 1-2 weeks Reduce amlodipine to 5 mg p.o. daily Started on tamsulosin for urinary retention Received Moderna COVID-19 booster while inpatient on 01/03/2021  Discharge Condition: Stable CODE STATUS: DNR Diet recommendation: Heart healthy diet  History of present illness:  Karen [name] is a 87 year old female with past medical history significant for essential hypertension, prior bladder tumor, CKD stage IIIa who presented to [place] ED on 7/24 after being found on the floor by her daughter-in-law.  Patient was confused and found to have acute renal failure.  Per daughter-in-law, she has had poor oral intake in the fat since few weeks.  Currently she is living home alone with family checking on her, but likely is not a good living situation.  Hospital service consulted for further evaluation and management of renal failure and adult failure to thrive.  Hospital course:  Acute kidney injury on chronic kidney disease stage IIIa/hyponatremia Based on labs from 2018 her creatinine at that time was 0.9.  Presented with creatinine of 2.6.  Patient was noted to be dehydrated.  Renal ultrasound does not show any hydronephrosis.  CT scan did not show any significant abnormalities.  Renal atrophy was noted on imaging studies.  Patient's daughter-in-law mentioned that patient has been having poor oral intake for the past few weeks.  She also felt that the patient has been slowly declining over the past many months.  No GI illness has been present recently.  Patient was started on IV fluids with improvement in renal function.  Creatinine is now down to 0.83 from a peak of 2.6  Acute urinary retention Was noted to have  distended bladder on examination.  In and out catheterization was done initially during admission.  Started on Flomax, and seems to have voided on her own now.  Urinalysis not suggestive of infection.  Continue monitor urine output outpatient.     Concern for oropharyngeal dysphagia Patient noted to have coughing episodes while eating her breakfast this morning.  Speech therapy has been consulted.  Changed diet to dysphagia 3 for now  Elevated lipase Abdomen is otherwise benign.  Elevated lipase could be due to acute renal failure.  Continue to monitor clinically for now.  Leukocytosis Most likely reactive versus hemoconcentration from dehydration.  No obvious source of infection has been found.  She is afebrile.  WBC count now 10.4, within normal limits.  Mildly elevated troponin Clinical significance unclear as patient denies any chest pain.  Normal anion gap metabolic acidosis: Resolved Likely due to hypovolemia.  Now resolved   Physical deconditioning Patient has experienced deconditioning over the past few weeks according to family.  Seen by PT and OT and SNF is recommended.  Discharging to [facility].  Discharge Diagnoses:  Active Problems:   Chronic kidney disease, stage 3a [facility])    Discharge Instructions  Discharge Instructions     Call MD for:  difficulty breathing, headache or visual disturbances   Complete by: As directed    Call MD for:  extreme fatigue   Complete by: As directed    Call MD for:  persistant dizziness or light-headedness   Complete by: As directed    Call MD for:  persistant nausea and vomiting   Complete by: As  directed    Call MD for:  severe uncontrolled pain   Complete by: As directed    Call MD for:  temperature >100.4   Complete by: As directed    Diet - low sodium heart healthy   Complete by: As directed    Increase activity slowly   Complete by: As directed       Allergies as of 01/03/2021       Reactions   Lisinopril  Swelling   Celebrex [celecoxib] Hives, Itching        Medication List     TAKE these medications    acetaminophen 500 MG tablet Commonly known as: TYLENOL Take 1,000 mg by mouth every 6 (six) hours as needed for mild pain.   amLODipine 5 MG tablet Commonly known as: NORVASC Take 2 tablets (10 mg total) by mouth daily. What changed: medication strength   aspirin EC 81 MG tablet Take 81 mg by mouth daily.   calcium carbonate 500 MG chewable tablet Commonly known as: TUMS - dosed in mg elemental calcium Chew 1 tablet by mouth as needed for heartburn.   fish oil-omega-3 fatty acids 1000 MG capsule Take 2 g by mouth 2 (two) times daily before a meal.   pravastatin 40 MG tablet Commonly known as: PRAVACHOL TAKE 1 TABLET BY MOUTH DAILY   Systane Balance 0.6 % Soln Generic drug: Propylene Glycol Place 1 drop into both eyes daily as needed (dry eyes).   tamsulosin 0.4 MG Caps capsule Commonly known as: FLOMAX Take 1 capsule (0.4 mg total) by mouth daily. Start taking on: January 04, 2021        Follow-up Information     Leeroy Cha, MD. Schedule an appointment as soon as possible for a visit in 1 week(s).   Specialty: Internal Medicine Contact information: [address] [place] [address] [phone]                Allergies  Allergen Reactions   Lisinopril Swelling   Celebrex [Celecoxib] Hives and Itching    Consultations: none   Procedures/Studies: CT ABDOMEN PELVIS WO CONTRAST  Result Date: 12/31/2020 CLINICAL DATA:  Nausea, vomiting, elevated lipase and acute kidney injury EXAM: CT ABDOMEN AND PELVIS WITHOUT CONTRAST TECHNIQUE: Multidetector CT imaging of the abdomen and pelvis was performed following the standard protocol without IV contrast. COMPARISON:  CT 01/24/2012 FINDINGS: Lower chest: Atelectatic changes in the lung bases. Normal cardiac size. Coronary artery calcifications. Trace pericardial fluid.  Hepatobiliary: Stable benign appearing punctate calcifications in the hepatic dome and left lobe. No concerning focal liver lesion. Smooth liver surface contour. Normal hepatic attenuation. No visible calcified gallstones or biliary ductal dilatation. No gross abnormality of the gallbladder though evaluation limited by upper abdominal motion artifact. Pancreas: Mild pancreatic atrophy. No pancreatic ductal dilatation or surrounding inflammatory changes. Spleen: Normal in size. No concerning splenic lesions. Adrenals/Urinary Tract: Normal adrenals. Bilateral renal atrophy. No concerning renal mass. No urolithiasis or hydronephrosis. Bladder is unremarkable for the degree of distention. Stomach/Bowel: Distal esophagus, stomach and duodenum are free of acute or worrisome abnormality. Normal duodenal sweep across the abdomen. No worrisome large or small bowel thickening or dilatation. Appendix is not visualized. No focal inflammation the vicinity of the cecum to suggest an occult appendicitis. Distal colonic diverticulosis without focal inflammation to suggest acute diverticulitis. Vascular/Lymphatic: Limited evaluation the absence of contrast. Extensive atherosclerotic calcifications throughout the aorta and branch vessels without worrisome aneurysm or ectasia. No pathologically enlarged nodes. Reproductive: Uterus  is surgically absent. No concerning adnexal lesions. Surgical changes along the bilateral pelvic sidewalls. Other: No abdominopelvic free air or fluid. No bowel containing hernia. Soft tissue thickening superficial to the posterosuperior iliac spines and bilateral iliac crests, as well as several of the spinous processes. Correlate with visual inspection to exclude developing decubitus ulceration. Musculoskeletal: Interval development of a vertebra plana deformity at the L1 level which appears overall chronic in nature. Some focal dextrocurvature noted at this level as well. Additional remote appearing  superior endplate deformity L3 and minimal anterior wedging T12. No clear acute fracture or traumatic osseous injury is seen. IMPRESSION: No clear acute CT abnormality to provide cause for patient's symptoms. Specifically, fairly unremarkable appearance of the pancreas and biliary tree accounting for motion artifact. If there is persisting clinical concern, right upper quadrant ultrasound could be obtained. Remote appearing compression deformities including vertebral plana at L1, increasing superior endplate convexity L3 and anterior wedging T12. Coronary artery atherosclerosis. Aortic Atherosclerosis (ICD10-I70.0). Electronically Signed   By: Lovena Le M.D.   On: 12/31/2020 19:24   CT Head Wo Contrast  Result Date: 12/31/2020 CLINICAL DATA:  Nausea and dry heaving. [place] out of bed this morning. EXAM: CT HEAD WITHOUT CONTRAST TECHNIQUE: Contiguous axial images were obtained from the base of the skull through the vertex without intravenous contrast. COMPARISON:  None. FINDINGS: Brain: No evidence of acute infarction, hemorrhage, hydrocephalus, extra-axial collection or mass lesion/mass effect. There is mild ventricular sulcal enlargement reflecting age-appropriate volume loss. Patchy areas of white matter hypoattenuation are noted bilaterally consistent with mild to moderate chronic microvascular ischemic change. Vascular: No hyperdense vessel or unexpected calcification. Skull: Normal. Negative for fracture or focal lesion. Sinuses/Orbits: Globes and orbits are unremarkable. Visualized sinuses are clear. Other: None. IMPRESSION: 1. No acute intracranial abnormalities. 2. Age-appropriate volume loss and mild to moderate chronic microvascular ischemic change. Electronically Signed   By: Lajean Manes M.D.   On: 12/31/2020 17:32   US RENAL  Result Date: 12/31/2020 CLINICAL DATA:  Acute kidney injury. Trans urethral resection of bladder tumor. Hypertension. EXAM: RENAL / URINARY TRACT ULTRASOUND COMPLETE  COMPARISON:  CT abdomen and pelvis 12/31/2020 FINDINGS: Right Kidney: Renal measurements: 7.9 x 3.7 x 4.6 cm = volume: 70 mL. Diffuse parenchymal atrophy with increased echotexture of the renal parenchyma suggesting chronic medical renal disease. No hydronephrosis. Left Kidney: Renal measurements: 7.7 x 4.1 x 4.5 cm = volume: 44 mL. Diffuse parenchymal atrophy with increased echotexture of the parenchyma suggesting chronic medical renal disease. No hydronephrosis. Bladder: Appears normal for degree of bladder distention. Other: None. IMPRESSION: Bilateral renal parenchymal atrophy. No focal lesion. No hydronephrosis. Electronically Signed   By: Lucienne Capers M.D.   On: 12/31/2020 21:41   DG Chest Port 1 View  Result Date: 12/31/2020 CLINICAL DATA:  fall, weakness EXAM: PORTABLE CHEST 1 VIEW COMPARISON:  May 12, 2017 FINDINGS: Evaluation is limited secondary to patient rotation. The cardiomediastinal silhouette is unchanged in contour. No pleural effusion. No pneumothorax. No acute pleuroparenchymal abnormality. Visualized abdomen is unremarkable. Multilevel degenerative changes of the thoracic spine. Skin fold overlying the mid mediastinum. IMPRESSION: No acute cardiopulmonary abnormality. Electronically Signed   By: Valentino Saxon MD   On: 12/31/2020 17:27     Subjective: Patient seen examined at bedside, resting comfortably.  No specific complaints this morning.  Daughter-in-law present at bedside.  Discharging to SNF today.  Denies chest pain, no palpitations, no shortness of breath, no abdominal pain.  No acute events overnight per nursing staff.  Discharge Exam: Vitals:   01/02/21 1941 01/03/21 0530  BP: 116/79 (!) 144/65  Pulse: 96 94  Resp: (!) 21 18  Temp: 98.7 F (37.1 C) 97.6 F (36.4 C)  SpO2: 96% 95%   Vitals:   01/02/21 0233 01/02/21 1735 01/02/21 1941 01/03/21 0530  BP: 137/64 (!) 143/69 116/79 (!) 144/65  Pulse: 87 91 96 94  Resp: 16 16 (!) 21 18  Temp: 97.7 F  (36.5 C) 98.3 F (36.8 C) 98.7 F (37.1 C) 97.6 F (36.4 C)  TempSrc: Oral Oral Oral Oral  SpO2: 93% 93% 96% 95%  Weight:      Height:        General: Pt is alert, awake, not in acute distress Cardiovascular: RRR, S1/S2 +, no rubs, no gallops Respiratory: CTA bilaterally, no wheezing, no rhonchi, on room air Abdominal: Soft, NT, ND, bowel sounds + Extremities: no edema, no cyanosis    The results of significant diagnostics from this hospitalization (including imaging, microbiology, ancillary and laboratory) are listed below for reference.     Microbiology: Recent Results (from the past 240 hour(s))  Resp Panel by RT-PCR (Flu A&B, Covid) Nasopharyngeal Swab     Status: None   Collection Time: 01/01/21  1:55 PM   Specimen: Nasopharyngeal Swab; Nasopharyngeal(NP) swabs in vial transport medium  Result Value Ref Range Status   SARS Coronavirus 2 by RT PCR NEGATIVE NEGATIVE Final    Comment: (NOTE) SARS-CoV-2 target nucleic acids are NOT DETECTED.  The SARS-CoV-2 RNA is generally detectable in upper respiratory specimens during the acute phase of infection. The lowest concentration of SARS-CoV-2 viral copies this assay can detect is 138 copies/mL. A negative result does not preclude SARS-Cov-2 infection and should not be used as the sole basis for treatment or other patient management decisions. A negative result may occur with  improper specimen collection/handling, submission of specimen other than nasopharyngeal swab, presence of viral mutation(s) within the areas targeted by this assay, and inadequate number of viral copies(<138 copies/mL). A negative result must be combined with clinical observations, patient history, and epidemiological information. The expected result is Negative.  Fact Sheet for Patients:  EntrepreneurPulse.com.au  Fact Sheet for Healthcare Providers:  IncredibleEmployment.be  This test is no t yet approved or  cleared by the [place] FDA and  has been authorized for detection and/or diagnosis of SARS-CoV-2 by FDA under an Emergency Use Authorization (EUA). This EUA will remain  in effect (meaning this test can be used) for the duration of the COVID-19 declaration under Section 564(b)(1) of the Act, 21 U.S.C.section 360bbb-3(b)(1), unless the authorization is terminated  or revoked sooner.       Influenza A by PCR NEGATIVE NEGATIVE Final   Influenza B by PCR NEGATIVE NEGATIVE Final    Comment: (NOTE) The Xpert Xpress SARS-CoV-2/FLU/RSV plus assay is intended as an aid in the diagnosis of influenza from Nasopharyngeal swab specimens and should not be used as a sole basis for treatment. Nasal washings and aspirates are unacceptable for Xpert Xpress SARS-CoV-2/FLU/RSV testing.  Fact Sheet for Patients: EntrepreneurPulse.com.au  Fact Sheet for Healthcare Providers: IncredibleEmployment.be  This test is not yet approved or cleared by the [place] FDA and has been authorized for detection and/or diagnosis of SARS-CoV-2 by FDA under an Emergency Use Authorization (EUA). This EUA will remain in effect (meaning this test can be used) for the duration of the COVID-19 declaration under Section 564(b)(1) of the Act, 21 U.S.C. section 360bbb-3(b)(1), unless the authorization is terminated or  revoked.  Performed at [facility], [place] [address], [place], [place] [address]      Labs: BNP (last 3 results) No results for input(s): BNP in the last 8760 hours. Basic Metabolic Panel: Recent Labs  Lab 12/31/20 1701 01/01/21 0301 01/02/21 0603 01/03/21 0557  NA 134* 134* 135 138  K 4.1 4.3 4.0 4.1  CL 101 104 109 105  CO2 18* 20* 22 24  GLUCOSE 126* 97 93 109*  BUN 36* 34* 32* 22  CREATININE 2.60* 1.95* 1.11* 0.83  CALCIUM 9.4 8.7* 8.5* 8.6*   Liver Function Tests: Recent Labs  Lab 12/31/20 1701 01/01/21 0301  01/02/21 0603 01/03/21 0557  AST 37 31 35 34  ALT '21 18 20 24  '$ ALKPHOS 77 61 54 57  BILITOT 0.9 0.7 0.5 0.5  PROT 7.5 6.0* 5.5* 5.9*  ALBUMIN 4.0 3.2* 2.8* 2.9*   Recent Labs  Lab 12/31/20 1701  LIPASE 205*   No results for input(s): AMMONIA in the last 168 hours. CBC: Recent Labs  Lab 12/31/20 1701 01/01/21 0301 01/02/21 0603 01/03/21 0557  WBC 20.9* 18.1* 11.5* 10.4  NEUTROABS 18.2*  --   --   --   HGB 12.8 12.0 11.6* 11.7*  HCT 40.6 37.8 36.7 37.6  MCV 89.4 90.2 90.0 91.5  PLT 308 275 212 209   Cardiac Enzymes: Recent Labs  Lab 12/31/20 1701  CKTOTAL 357*   BNP: Invalid input(s): POCBNP CBG: No results for input(s): GLUCAP in the last 168 hours. D-Dimer No results for input(s): DDIMER in the last 72 hours. Hgb A1c No results for input(s): HGBA1C in the last 72 hours. Lipid Profile No results for input(s): CHOL, HDL, LDLCALC, TRIG, CHOLHDL, LDLDIRECT in the last 72 hours. Thyroid function studies No results for input(s): TSH, T4TOTAL, T3FREE, THYROIDAB in the last 72 hours.  Invalid input(s): FREET3 Anemia work up No results for input(s): VITAMINB12, FOLATE, FERRITIN, TIBC, IRON, RETICCTPCT in the last 72 hours. Urinalysis    Component Value Date/Time   COLORURINE YELLOW 12/31/2020 1600   APPEARANCEUR HAZY (A) 12/31/2020 1600   LABSPEC 1.013 12/31/2020 1600   PHURINE 5.0 12/31/2020 1600   GLUCOSEU NEGATIVE 12/31/2020 1600   HGBUR NEGATIVE 12/31/2020 1600   BILIRUBINUR NEGATIVE 12/31/2020 1600   KETONESUR NEGATIVE 12/31/2020 1600   PROTEINUR NEGATIVE 12/31/2020 1600   UROBILINOGEN 0.2 01/16/2010 1147   NITRITE NEGATIVE 12/31/2020 1600   LEUKOCYTESUR NEGATIVE 12/31/2020 1600   Sepsis Labs Invalid input(s): PROCALCITONIN,  WBC,  LACTICIDVEN Microbiology Recent Results (from the past 240 hour(s))  Resp Panel by RT-PCR (Flu A&B, Covid) Nasopharyngeal Swab     Status: None   Collection Time: 01/01/21  1:55 PM   Specimen: Nasopharyngeal Swab;  Nasopharyngeal(NP) swabs in vial transport medium  Result Value Ref Range Status   SARS Coronavirus 2 by RT PCR NEGATIVE NEGATIVE Final    Comment: (NOTE) SARS-CoV-2 target nucleic acids are NOT DETECTED.  The SARS-CoV-2 RNA is generally detectable in upper respiratory specimens during the acute phase of infection. The lowest concentration of SARS-CoV-2 viral copies this assay can detect is 138 copies/mL. A negative result does not preclude SARS-Cov-2 infection and should not be used as the sole basis for treatment or other patient management decisions. A negative result may occur with  improper specimen collection/handling, submission of specimen other than nasopharyngeal swab, presence of viral mutation(s) within the areas targeted by this assay, and inadequate number of viral copies(<138 copies/mL). A negative result must be combined with clinical  observations, patient history, and epidemiological information. The expected result is Negative.  Fact Sheet for Patients:  EntrepreneurPulse.com.au  Fact Sheet for Healthcare Providers:  IncredibleEmployment.be  This test is no t yet approved or cleared by the [place] FDA and  has been authorized for detection and/or diagnosis of SARS-CoV-2 by FDA under an Emergency Use Authorization (EUA). This EUA will remain  in effect (meaning this test can be used) for the duration of the COVID-19 declaration under Section 564(b)(1) of the Act, 21 U.S.C.section 360bbb-3(b)(1), unless the authorization is terminated  or revoked sooner.       Influenza A by PCR NEGATIVE NEGATIVE Final   Influenza B by PCR NEGATIVE NEGATIVE Final    Comment: (NOTE) The Xpert Xpress SARS-CoV-2/FLU/RSV plus assay is intended as an aid in the diagnosis of influenza from Nasopharyngeal swab specimens and should not be used as a sole basis for treatment. Nasal washings and aspirates are unacceptable for Xpert Xpress  SARS-CoV-2/FLU/RSV testing.  Fact Sheet for Patients: EntrepreneurPulse.com.au  Fact Sheet for Healthcare Providers: IncredibleEmployment.be  This test is not yet approved or cleared by the [place] FDA and has been authorized for detection and/or diagnosis of SARS-CoV-2 by FDA under an Emergency Use Authorization (EUA). This EUA will remain in effect (meaning this test can be used) for the duration of the COVID-19 declaration under Section 564(b)(1) of the Act, 21 U.S.C. section 360bbb-3(b)(1), unless the authorization is terminated or revoked.  Performed at [facility], [place] [address] [place], [place] [address]      Time coordinating discharge: Over 30 minutes  SIGNED:   Donnamarie Poag [place] DO  [facility] 01/03/2021, 12:38 PM

## 2021-01-03 NOTE — Progress Notes (Signed)
Report called to Gerilyn Nestle RN at [facility]. Awaiting [facility] for transport

## 2021-01-03 NOTE — Progress Notes (Signed)
Occupational Therapy Treatment Patient Details Name: Karen [name] MRN: [id number] DOB: Nov 18, 1917 Today's Date: 01/03/2021    History of present illness 87 y.o. female with medical history significant of HTN, prior bladder tumor, CKD 3a.  Patient presented to the emergency department after being found on the floor by her daughter-in-law.  She seemed a bit confused.  Patient was found to have acute renal failure.  According to daughter-in-law patient has had poor oral intake the last few weeks.   Pt initially denied falling, but endorsed a fall during OT Evaluation.   OT comments  Patient was lying in bed at start of session. Patient required min guard for supine to sit on edge of bed with HOB elevated. Patient was min A for transfer from edge of bed to commode in bathroom with rolling walker with increased cues to slow down. Patient was min guard for hygiene tasks in standing. Patient required min set up for oral care seated in recliner in room. Patient plans to d/c to SNF this afternoon. D/c plan remains appropriate at this time.  Follow Up Recommendations  SNF    Equipment Recommendations  None recommended by OT    Recommendations for Other Services      Precautions / Restrictions Precautions Precautions: Fall Restrictions Weight Bearing Restrictions: No       Mobility Bed Mobility                    Transfers                      Balance Overall balance assessment: History of Falls;Needs assistance Sitting-balance support: Feet unsupported Sitting balance-Leahy Scale: Good   Postural control: Posterior lean Standing balance support: Bilateral upper extremity supported Standing balance-Leahy Scale: Poor                             ADL either performed or assessed with clinical judgement   ADL       Grooming: Wash/dry hands;Set up;Sitting                   Toilet Transfer: Designer, television/film set Details (indicate cue type  and reason): with education to slow down Toileting- Clothing Manipulation and Hygiene: Sit to/from stand;Min guard Toileting - Clothing Manipulation Details (indicate cue type and reason): patient completed hygiene tasks in standing with RW     Functional mobility during ADLs: Min guard;Minimal assistance;Rolling walker General ADL Comments: patient was noted to be completing functional mobility quickly to attempt to get to bathroom. patient required increased cues to slow down for environment set up with patient noted to almost walk into daughter in law while being cued to slow down.     Vision       Perception     Praxis      Cognition Arousal/Alertness: Awake/alert Behavior During Therapy: WFL for tasks assessed/performed Overall Cognitive Status: History of cognitive impairments - at baseline                                          Exercises     Shoulder Instructions       General Comments      Pertinent Vitals/ Pain       Pain Assessment: No/denies pain Pain Score: 4  Pain Location: back Pain Descriptors /  Indicators: Aching Pain Intervention(s): Limited activity within patient's tolerance  Home Living                                          Prior Functioning/Environment              Frequency  Min 2X/week        Progress Toward Goals  OT Goals(current goals can now be found in the care plan section)  Progress towards OT goals: Progressing toward goals  Acute Rehab OT Goals Patient Stated Goal: to get back to ILF OT Goal Formulation: With patient/family Time For Goal Achievement: 01/15/21 Potential to Achieve Goals: Good  Plan Discharge plan remains appropriate    Co-evaluation                 AM-PAC OT "6 Clicks" Daily Activity     Outcome Measure   Help from another person eating meals?: None Help from another person taking care of personal grooming?: A Little Help from another person  toileting, which includes using toliet, bedpan, or urinal?: A Little Help from another person bathing (including washing, rinsing, drying)?: A Little Help from another person to put on and taking off regular upper body clothing?: A Little Help from another person to put on and taking off regular lower body clothing?: A Little 6 Click Score: 19    End of Session Equipment Utilized During Treatment: Gait belt;Rolling walker  OT Visit Diagnosis: Unsteadiness on feet (R26.81)   Activity Tolerance Patient tolerated treatment well   Patient Left with family/visitor present;in chair;with call bell/phone within reach;with chair alarm set   Nurse Communication          Time: [id number] OT Time Calculation (min): 17 min  Charges: OT General Charges $OT Visit: 1 Visit OT Treatments $Self Care/Home Management : 8-22 mins Jackelyn Poling OTR/L, MS Acute Rehabilitation Department Office# [phone] Pager# [place] 01/03/2021, 12:59 PM

## 2021-01-03 NOTE — Progress Notes (Signed)
Patient discharged. Left via [facility] on stretcher. LFA IV removed. Escorted by [employer] staff and family. Patient tolerated well.

## 2021-01-03 NOTE — Progress Notes (Signed)
Checked bladder scan per order at 0415, resulted at 633cc. Pt had voided 200cc in purewick on this shift. Encouraged her to void again if she could, and she put out 100cc via [place]. I assisted her up to the [facility] and she was able to void 650cc. She is agreeable to try [facility] for all urges to void as she seems to empty her bladder much better this way. Hortencia Conradi RN

## 2021-01-05 DIAGNOSIS — R54 Age-related physical debility: Secondary | ICD-10-CM | POA: Diagnosis not present

## 2021-01-05 DIAGNOSIS — K219 Gastro-esophageal reflux disease without esophagitis: Secondary | ICD-10-CM | POA: Diagnosis not present

## 2021-01-05 DIAGNOSIS — R338 Other retention of urine: Secondary | ICD-10-CM | POA: Diagnosis not present

## 2021-01-05 DIAGNOSIS — E782 Mixed hyperlipidemia: Secondary | ICD-10-CM | POA: Diagnosis not present

## 2021-01-05 DIAGNOSIS — M1991 Primary osteoarthritis, unspecified site: Secondary | ICD-10-CM | POA: Diagnosis not present

## 2021-01-05 DIAGNOSIS — N178 Other acute kidney failure: Secondary | ICD-10-CM | POA: Diagnosis not present

## 2021-01-05 DIAGNOSIS — N1831 Chronic kidney disease, stage 3a: Secondary | ICD-10-CM | POA: Diagnosis not present

## 2021-01-05 DIAGNOSIS — R1312 Dysphagia, oropharyngeal phase: Secondary | ICD-10-CM | POA: Diagnosis not present

## 2021-01-05 DIAGNOSIS — I10 Essential (primary) hypertension: Secondary | ICD-10-CM | POA: Diagnosis not present

## 2021-01-05 DIAGNOSIS — D72829 Elevated white blood cell count, unspecified: Secondary | ICD-10-CM | POA: Diagnosis not present

## 2021-01-08 DIAGNOSIS — I10 Essential (primary) hypertension: Secondary | ICD-10-CM | POA: Diagnosis not present

## 2021-01-08 DIAGNOSIS — R54 Age-related physical debility: Secondary | ICD-10-CM | POA: Diagnosis not present

## 2021-01-08 DIAGNOSIS — M79672 Pain in left foot: Secondary | ICD-10-CM | POA: Diagnosis not present

## 2021-01-08 DIAGNOSIS — Z7409 Other reduced mobility: Secondary | ICD-10-CM | POA: Diagnosis not present

## 2021-01-08 DIAGNOSIS — H918X3 Other specified hearing loss, bilateral: Secondary | ICD-10-CM | POA: Diagnosis not present

## 2021-01-08 DIAGNOSIS — M19041 Primary osteoarthritis, right hand: Secondary | ICD-10-CM | POA: Diagnosis not present

## 2021-01-08 DIAGNOSIS — R338 Other retention of urine: Secondary | ICD-10-CM | POA: Diagnosis not present

## 2021-01-08 DIAGNOSIS — M19042 Primary osteoarthritis, left hand: Secondary | ICD-10-CM | POA: Diagnosis not present

## 2021-01-08 DIAGNOSIS — K219 Gastro-esophageal reflux disease without esophagitis: Secondary | ICD-10-CM | POA: Diagnosis not present

## 2021-01-08 DIAGNOSIS — N1831 Chronic kidney disease, stage 3a: Secondary | ICD-10-CM | POA: Diagnosis not present

## 2021-01-08 DIAGNOSIS — E782 Mixed hyperlipidemia: Secondary | ICD-10-CM | POA: Diagnosis not present

## 2021-01-08 DIAGNOSIS — R1312 Dysphagia, oropharyngeal phase: Secondary | ICD-10-CM | POA: Diagnosis not present

## 2021-01-16 DIAGNOSIS — K219 Gastro-esophageal reflux disease without esophagitis: Secondary | ICD-10-CM | POA: Diagnosis not present

## 2021-01-16 DIAGNOSIS — H918X3 Other specified hearing loss, bilateral: Secondary | ICD-10-CM | POA: Diagnosis not present

## 2021-01-16 DIAGNOSIS — M19042 Primary osteoarthritis, left hand: Secondary | ICD-10-CM | POA: Diagnosis not present

## 2021-01-16 DIAGNOSIS — E782 Mixed hyperlipidemia: Secondary | ICD-10-CM | POA: Diagnosis not present

## 2021-01-16 DIAGNOSIS — R54 Age-related physical debility: Secondary | ICD-10-CM | POA: Diagnosis not present

## 2021-01-16 DIAGNOSIS — M19041 Primary osteoarthritis, right hand: Secondary | ICD-10-CM | POA: Diagnosis not present

## 2021-01-16 DIAGNOSIS — R1312 Dysphagia, oropharyngeal phase: Secondary | ICD-10-CM | POA: Diagnosis not present

## 2021-01-16 DIAGNOSIS — Z7409 Other reduced mobility: Secondary | ICD-10-CM | POA: Diagnosis not present

## 2021-01-16 DIAGNOSIS — M79672 Pain in left foot: Secondary | ICD-10-CM | POA: Diagnosis not present

## 2021-01-16 DIAGNOSIS — R338 Other retention of urine: Secondary | ICD-10-CM | POA: Diagnosis not present

## 2021-01-16 DIAGNOSIS — I10 Essential (primary) hypertension: Secondary | ICD-10-CM | POA: Diagnosis not present

## 2021-01-16 DIAGNOSIS — N1831 Chronic kidney disease, stage 3a: Secondary | ICD-10-CM | POA: Diagnosis not present

## 2021-01-18 DIAGNOSIS — N1831 Chronic kidney disease, stage 3a: Secondary | ICD-10-CM | POA: Diagnosis not present

## 2021-01-18 DIAGNOSIS — M19041 Primary osteoarthritis, right hand: Secondary | ICD-10-CM | POA: Diagnosis not present

## 2021-01-18 DIAGNOSIS — H918X3 Other specified hearing loss, bilateral: Secondary | ICD-10-CM | POA: Diagnosis not present

## 2021-01-18 DIAGNOSIS — E782 Mixed hyperlipidemia: Secondary | ICD-10-CM | POA: Diagnosis not present

## 2021-01-18 DIAGNOSIS — R1312 Dysphagia, oropharyngeal phase: Secondary | ICD-10-CM | POA: Diagnosis not present

## 2021-01-18 DIAGNOSIS — I10 Essential (primary) hypertension: Secondary | ICD-10-CM | POA: Diagnosis not present

## 2021-01-18 DIAGNOSIS — N178 Other acute kidney failure: Secondary | ICD-10-CM | POA: Diagnosis not present

## 2021-01-18 DIAGNOSIS — Z7409 Other reduced mobility: Secondary | ICD-10-CM | POA: Diagnosis not present

## 2021-01-18 DIAGNOSIS — M19042 Primary osteoarthritis, left hand: Secondary | ICD-10-CM | POA: Diagnosis not present

## 2021-01-18 DIAGNOSIS — R338 Other retention of urine: Secondary | ICD-10-CM | POA: Diagnosis not present

## 2021-01-18 DIAGNOSIS — R54 Age-related physical debility: Secondary | ICD-10-CM | POA: Diagnosis not present

## 2021-01-18 DIAGNOSIS — K219 Gastro-esophageal reflux disease without esophagitis: Secondary | ICD-10-CM | POA: Diagnosis not present

## 2021-01-22 DIAGNOSIS — M2041 Other hammer toe(s) (acquired), right foot: Secondary | ICD-10-CM | POA: Diagnosis not present

## 2021-01-22 DIAGNOSIS — B351 Tinea unguium: Secondary | ICD-10-CM | POA: Diagnosis not present

## 2021-01-22 DIAGNOSIS — I739 Peripheral vascular disease, unspecified: Secondary | ICD-10-CM | POA: Diagnosis not present

## 2021-01-22 DIAGNOSIS — M2012 Hallux valgus (acquired), left foot: Secondary | ICD-10-CM | POA: Diagnosis not present

## 2021-01-22 DIAGNOSIS — M2011 Hallux valgus (acquired), right foot: Secondary | ICD-10-CM | POA: Diagnosis not present

## 2021-01-22 DIAGNOSIS — M2042 Other hammer toe(s) (acquired), left foot: Secondary | ICD-10-CM | POA: Diagnosis not present

## 2021-01-24 DIAGNOSIS — M1990 Unspecified osteoarthritis, unspecified site: Secondary | ICD-10-CM | POA: Diagnosis not present

## 2021-01-24 DIAGNOSIS — R600 Localized edema: Secondary | ICD-10-CM | POA: Diagnosis not present

## 2021-01-24 DIAGNOSIS — R5381 Other malaise: Secondary | ICD-10-CM | POA: Diagnosis not present

## 2021-01-24 DIAGNOSIS — F32A Depression, unspecified: Secondary | ICD-10-CM | POA: Diagnosis not present

## 2021-01-24 DIAGNOSIS — I10 Essential (primary) hypertension: Secondary | ICD-10-CM | POA: Diagnosis not present

## 2021-01-24 DIAGNOSIS — N189 Chronic kidney disease, unspecified: Secondary | ICD-10-CM | POA: Diagnosis not present

## 2021-01-24 DIAGNOSIS — I6529 Occlusion and stenosis of unspecified carotid artery: Secondary | ICD-10-CM | POA: Diagnosis not present

## 2021-01-25 DIAGNOSIS — R600 Localized edema: Secondary | ICD-10-CM | POA: Diagnosis not present

## 2021-01-25 DIAGNOSIS — R5381 Other malaise: Secondary | ICD-10-CM | POA: Diagnosis not present

## 2021-01-25 DIAGNOSIS — N189 Chronic kidney disease, unspecified: Secondary | ICD-10-CM | POA: Diagnosis not present

## 2021-01-25 DIAGNOSIS — I10 Essential (primary) hypertension: Secondary | ICD-10-CM | POA: Diagnosis not present

## 2021-01-25 DIAGNOSIS — N1831 Chronic kidney disease, stage 3a: Secondary | ICD-10-CM | POA: Diagnosis not present

## 2021-01-25 DIAGNOSIS — R601 Generalized edema: Secondary | ICD-10-CM | POA: Diagnosis not present

## 2021-01-27 DIAGNOSIS — R2681 Unsteadiness on feet: Secondary | ICD-10-CM | POA: Diagnosis not present

## 2021-01-27 DIAGNOSIS — M6281 Muscle weakness (generalized): Secondary | ICD-10-CM | POA: Diagnosis not present

## 2021-01-27 DIAGNOSIS — N179 Acute kidney failure, unspecified: Secondary | ICD-10-CM | POA: Diagnosis not present

## 2021-01-30 DIAGNOSIS — N179 Acute kidney failure, unspecified: Secondary | ICD-10-CM | POA: Diagnosis not present

## 2021-01-30 DIAGNOSIS — I10 Essential (primary) hypertension: Secondary | ICD-10-CM | POA: Diagnosis not present

## 2021-01-30 DIAGNOSIS — D510 Vitamin B12 deficiency anemia due to intrinsic factor deficiency: Secondary | ICD-10-CM | POA: Diagnosis not present

## 2021-01-30 DIAGNOSIS — R2681 Unsteadiness on feet: Secondary | ICD-10-CM | POA: Diagnosis not present

## 2021-01-30 DIAGNOSIS — M6281 Muscle weakness (generalized): Secondary | ICD-10-CM | POA: Diagnosis not present

## 2021-01-31 DIAGNOSIS — N179 Acute kidney failure, unspecified: Secondary | ICD-10-CM | POA: Diagnosis not present

## 2021-01-31 DIAGNOSIS — M6281 Muscle weakness (generalized): Secondary | ICD-10-CM | POA: Diagnosis not present

## 2021-01-31 DIAGNOSIS — R2681 Unsteadiness on feet: Secondary | ICD-10-CM | POA: Diagnosis not present

## 2021-02-01 DIAGNOSIS — M6281 Muscle weakness (generalized): Secondary | ICD-10-CM | POA: Diagnosis not present

## 2021-02-01 DIAGNOSIS — N179 Acute kidney failure, unspecified: Secondary | ICD-10-CM | POA: Diagnosis not present

## 2021-02-01 DIAGNOSIS — R2681 Unsteadiness on feet: Secondary | ICD-10-CM | POA: Diagnosis not present

## 2021-02-03 DIAGNOSIS — R2681 Unsteadiness on feet: Secondary | ICD-10-CM | POA: Diagnosis not present

## 2021-02-03 DIAGNOSIS — N179 Acute kidney failure, unspecified: Secondary | ICD-10-CM | POA: Diagnosis not present

## 2021-02-03 DIAGNOSIS — M6281 Muscle weakness (generalized): Secondary | ICD-10-CM | POA: Diagnosis not present

## 2021-02-05 DIAGNOSIS — N179 Acute kidney failure, unspecified: Secondary | ICD-10-CM | POA: Diagnosis not present

## 2021-02-05 DIAGNOSIS — I7091 Generalized atherosclerosis: Secondary | ICD-10-CM | POA: Diagnosis not present

## 2021-02-05 DIAGNOSIS — L603 Nail dystrophy: Secondary | ICD-10-CM | POA: Diagnosis not present

## 2021-02-05 DIAGNOSIS — B351 Tinea unguium: Secondary | ICD-10-CM | POA: Diagnosis not present

## 2021-02-05 DIAGNOSIS — R2681 Unsteadiness on feet: Secondary | ICD-10-CM | POA: Diagnosis not present

## 2021-02-05 DIAGNOSIS — M6281 Muscle weakness (generalized): Secondary | ICD-10-CM | POA: Diagnosis not present

## 2021-02-06 DIAGNOSIS — N179 Acute kidney failure, unspecified: Secondary | ICD-10-CM | POA: Diagnosis not present

## 2021-02-06 DIAGNOSIS — M6281 Muscle weakness (generalized): Secondary | ICD-10-CM | POA: Diagnosis not present

## 2021-02-06 DIAGNOSIS — R2681 Unsteadiness on feet: Secondary | ICD-10-CM | POA: Diagnosis not present

## 2021-02-09 DIAGNOSIS — N179 Acute kidney failure, unspecified: Secondary | ICD-10-CM | POA: Diagnosis not present

## 2021-02-09 DIAGNOSIS — R2681 Unsteadiness on feet: Secondary | ICD-10-CM | POA: Diagnosis not present

## 2021-02-09 DIAGNOSIS — M6281 Muscle weakness (generalized): Secondary | ICD-10-CM | POA: Diagnosis not present

## 2021-02-12 DIAGNOSIS — N179 Acute kidney failure, unspecified: Secondary | ICD-10-CM | POA: Diagnosis not present

## 2021-02-12 DIAGNOSIS — M6281 Muscle weakness (generalized): Secondary | ICD-10-CM | POA: Diagnosis not present

## 2021-02-12 DIAGNOSIS — R2681 Unsteadiness on feet: Secondary | ICD-10-CM | POA: Diagnosis not present

## 2021-02-13 DIAGNOSIS — M6281 Muscle weakness (generalized): Secondary | ICD-10-CM | POA: Diagnosis not present

## 2021-02-13 DIAGNOSIS — R2681 Unsteadiness on feet: Secondary | ICD-10-CM | POA: Diagnosis not present

## 2021-02-13 DIAGNOSIS — N179 Acute kidney failure, unspecified: Secondary | ICD-10-CM | POA: Diagnosis not present

## 2021-02-14 DIAGNOSIS — N179 Acute kidney failure, unspecified: Secondary | ICD-10-CM | POA: Diagnosis not present

## 2021-02-14 DIAGNOSIS — M6281 Muscle weakness (generalized): Secondary | ICD-10-CM | POA: Diagnosis not present

## 2021-02-14 DIAGNOSIS — R2681 Unsteadiness on feet: Secondary | ICD-10-CM | POA: Diagnosis not present

## 2021-02-15 DIAGNOSIS — N179 Acute kidney failure, unspecified: Secondary | ICD-10-CM | POA: Diagnosis not present

## 2021-02-15 DIAGNOSIS — R2681 Unsteadiness on feet: Secondary | ICD-10-CM | POA: Diagnosis not present

## 2021-02-15 DIAGNOSIS — M6281 Muscle weakness (generalized): Secondary | ICD-10-CM | POA: Diagnosis not present

## 2021-02-16 DIAGNOSIS — M6281 Muscle weakness (generalized): Secondary | ICD-10-CM | POA: Diagnosis not present

## 2021-02-16 DIAGNOSIS — R2681 Unsteadiness on feet: Secondary | ICD-10-CM | POA: Diagnosis not present

## 2021-02-16 DIAGNOSIS — N179 Acute kidney failure, unspecified: Secondary | ICD-10-CM | POA: Diagnosis not present

## 2021-02-20 DIAGNOSIS — N179 Acute kidney failure, unspecified: Secondary | ICD-10-CM | POA: Diagnosis not present

## 2021-02-20 DIAGNOSIS — M6281 Muscle weakness (generalized): Secondary | ICD-10-CM | POA: Diagnosis not present

## 2021-02-20 DIAGNOSIS — R2681 Unsteadiness on feet: Secondary | ICD-10-CM | POA: Diagnosis not present

## 2021-02-21 DIAGNOSIS — N179 Acute kidney failure, unspecified: Secondary | ICD-10-CM | POA: Diagnosis not present

## 2021-02-21 DIAGNOSIS — M6281 Muscle weakness (generalized): Secondary | ICD-10-CM | POA: Diagnosis not present

## 2021-02-21 DIAGNOSIS — R2681 Unsteadiness on feet: Secondary | ICD-10-CM | POA: Diagnosis not present

## 2021-02-23 DIAGNOSIS — N179 Acute kidney failure, unspecified: Secondary | ICD-10-CM | POA: Diagnosis not present

## 2021-02-23 DIAGNOSIS — R2681 Unsteadiness on feet: Secondary | ICD-10-CM | POA: Diagnosis not present

## 2021-02-23 DIAGNOSIS — M6281 Muscle weakness (generalized): Secondary | ICD-10-CM | POA: Diagnosis not present

## 2021-02-24 DIAGNOSIS — N179 Acute kidney failure, unspecified: Secondary | ICD-10-CM | POA: Diagnosis not present

## 2021-02-24 DIAGNOSIS — R2681 Unsteadiness on feet: Secondary | ICD-10-CM | POA: Diagnosis not present

## 2021-02-24 DIAGNOSIS — M6281 Muscle weakness (generalized): Secondary | ICD-10-CM | POA: Diagnosis not present

## 2021-02-26 DIAGNOSIS — N189 Chronic kidney disease, unspecified: Secondary | ICD-10-CM | POA: Diagnosis not present

## 2021-02-26 DIAGNOSIS — M1990 Unspecified osteoarthritis, unspecified site: Secondary | ICD-10-CM | POA: Diagnosis not present

## 2021-02-26 DIAGNOSIS — B372 Candidiasis of skin and nail: Secondary | ICD-10-CM | POA: Diagnosis not present

## 2021-02-26 DIAGNOSIS — F329 Major depressive disorder, single episode, unspecified: Secondary | ICD-10-CM | POA: Diagnosis not present

## 2021-02-26 DIAGNOSIS — R5381 Other malaise: Secondary | ICD-10-CM | POA: Diagnosis not present

## 2021-02-26 DIAGNOSIS — L84 Corns and callosities: Secondary | ICD-10-CM | POA: Diagnosis not present

## 2021-02-26 DIAGNOSIS — F419 Anxiety disorder, unspecified: Secondary | ICD-10-CM | POA: Diagnosis not present

## 2021-02-26 DIAGNOSIS — R600 Localized edema: Secondary | ICD-10-CM | POA: Diagnosis not present

## 2021-02-26 DIAGNOSIS — I10 Essential (primary) hypertension: Secondary | ICD-10-CM | POA: Diagnosis not present

## 2021-02-26 DIAGNOSIS — I6529 Occlusion and stenosis of unspecified carotid artery: Secondary | ICD-10-CM | POA: Diagnosis not present

## 2021-03-01 DIAGNOSIS — M6281 Muscle weakness (generalized): Secondary | ICD-10-CM | POA: Diagnosis not present

## 2021-03-01 DIAGNOSIS — R2681 Unsteadiness on feet: Secondary | ICD-10-CM | POA: Diagnosis not present

## 2021-03-01 DIAGNOSIS — N179 Acute kidney failure, unspecified: Secondary | ICD-10-CM | POA: Diagnosis not present

## 2021-03-02 DIAGNOSIS — M6281 Muscle weakness (generalized): Secondary | ICD-10-CM | POA: Diagnosis not present

## 2021-03-02 DIAGNOSIS — R2681 Unsteadiness on feet: Secondary | ICD-10-CM | POA: Diagnosis not present

## 2021-03-02 DIAGNOSIS — N179 Acute kidney failure, unspecified: Secondary | ICD-10-CM | POA: Diagnosis not present

## 2021-03-05 DIAGNOSIS — N179 Acute kidney failure, unspecified: Secondary | ICD-10-CM | POA: Diagnosis not present

## 2021-03-05 DIAGNOSIS — M6281 Muscle weakness (generalized): Secondary | ICD-10-CM | POA: Diagnosis not present

## 2021-03-05 DIAGNOSIS — R2681 Unsteadiness on feet: Secondary | ICD-10-CM | POA: Diagnosis not present

## 2021-03-06 DIAGNOSIS — M6281 Muscle weakness (generalized): Secondary | ICD-10-CM | POA: Diagnosis not present

## 2021-03-06 DIAGNOSIS — N179 Acute kidney failure, unspecified: Secondary | ICD-10-CM | POA: Diagnosis not present

## 2021-03-06 DIAGNOSIS — R2681 Unsteadiness on feet: Secondary | ICD-10-CM | POA: Diagnosis not present

## 2021-03-07 DIAGNOSIS — M6281 Muscle weakness (generalized): Secondary | ICD-10-CM | POA: Diagnosis not present

## 2021-03-07 DIAGNOSIS — R2681 Unsteadiness on feet: Secondary | ICD-10-CM | POA: Diagnosis not present

## 2021-03-07 DIAGNOSIS — N179 Acute kidney failure, unspecified: Secondary | ICD-10-CM | POA: Diagnosis not present

## 2021-03-08 DIAGNOSIS — N179 Acute kidney failure, unspecified: Secondary | ICD-10-CM | POA: Diagnosis not present

## 2021-03-08 DIAGNOSIS — R2681 Unsteadiness on feet: Secondary | ICD-10-CM | POA: Diagnosis not present

## 2021-03-08 DIAGNOSIS — M6281 Muscle weakness (generalized): Secondary | ICD-10-CM | POA: Diagnosis not present

## 2021-03-09 DIAGNOSIS — M6281 Muscle weakness (generalized): Secondary | ICD-10-CM | POA: Diagnosis not present

## 2021-03-09 DIAGNOSIS — N179 Acute kidney failure, unspecified: Secondary | ICD-10-CM | POA: Diagnosis not present

## 2021-03-09 DIAGNOSIS — R2681 Unsteadiness on feet: Secondary | ICD-10-CM | POA: Diagnosis not present

## 2021-03-11 DIAGNOSIS — R600 Localized edema: Secondary | ICD-10-CM | POA: Diagnosis not present

## 2021-03-11 DIAGNOSIS — I10 Essential (primary) hypertension: Secondary | ICD-10-CM | POA: Diagnosis not present

## 2021-03-11 DIAGNOSIS — F32A Depression, unspecified: Secondary | ICD-10-CM | POA: Diagnosis not present

## 2021-03-11 DIAGNOSIS — F419 Anxiety disorder, unspecified: Secondary | ICD-10-CM | POA: Diagnosis not present

## 2021-03-12 DIAGNOSIS — N179 Acute kidney failure, unspecified: Secondary | ICD-10-CM | POA: Diagnosis not present

## 2021-03-12 DIAGNOSIS — R2681 Unsteadiness on feet: Secondary | ICD-10-CM | POA: Diagnosis not present

## 2021-03-12 DIAGNOSIS — M6281 Muscle weakness (generalized): Secondary | ICD-10-CM | POA: Diagnosis not present

## 2021-03-13 DIAGNOSIS — N179 Acute kidney failure, unspecified: Secondary | ICD-10-CM | POA: Diagnosis not present

## 2021-03-13 DIAGNOSIS — R2681 Unsteadiness on feet: Secondary | ICD-10-CM | POA: Diagnosis not present

## 2021-03-13 DIAGNOSIS — I10 Essential (primary) hypertension: Secondary | ICD-10-CM | POA: Diagnosis not present

## 2021-03-13 DIAGNOSIS — M6281 Muscle weakness (generalized): Secondary | ICD-10-CM | POA: Diagnosis not present

## 2021-03-26 DIAGNOSIS — N189 Chronic kidney disease, unspecified: Secondary | ICD-10-CM | POA: Diagnosis not present

## 2021-03-26 DIAGNOSIS — M1990 Unspecified osteoarthritis, unspecified site: Secondary | ICD-10-CM | POA: Diagnosis not present

## 2021-03-26 DIAGNOSIS — L989 Disorder of the skin and subcutaneous tissue, unspecified: Secondary | ICD-10-CM | POA: Diagnosis not present

## 2021-03-26 DIAGNOSIS — R600 Localized edema: Secondary | ICD-10-CM | POA: Diagnosis not present

## 2021-03-26 DIAGNOSIS — I10 Essential (primary) hypertension: Secondary | ICD-10-CM | POA: Diagnosis not present

## 2021-03-26 DIAGNOSIS — I6529 Occlusion and stenosis of unspecified carotid artery: Secondary | ICD-10-CM | POA: Diagnosis not present

## 2021-03-28 DIAGNOSIS — I10 Essential (primary) hypertension: Secondary | ICD-10-CM | POA: Diagnosis not present

## 2021-04-03 DIAGNOSIS — F329 Major depressive disorder, single episode, unspecified: Secondary | ICD-10-CM | POA: Diagnosis not present

## 2021-04-03 DIAGNOSIS — M1990 Unspecified osteoarthritis, unspecified site: Secondary | ICD-10-CM | POA: Diagnosis not present

## 2021-04-03 DIAGNOSIS — N189 Chronic kidney disease, unspecified: Secondary | ICD-10-CM | POA: Diagnosis not present

## 2021-04-03 DIAGNOSIS — I10 Essential (primary) hypertension: Secondary | ICD-10-CM | POA: Diagnosis not present

## 2021-04-03 DIAGNOSIS — R011 Cardiac murmur, unspecified: Secondary | ICD-10-CM | POA: Diagnosis not present

## 2021-04-05 DIAGNOSIS — M1990 Unspecified osteoarthritis, unspecified site: Secondary | ICD-10-CM | POA: Diagnosis not present

## 2021-04-05 DIAGNOSIS — L989 Disorder of the skin and subcutaneous tissue, unspecified: Secondary | ICD-10-CM | POA: Diagnosis not present

## 2021-04-05 DIAGNOSIS — F419 Anxiety disorder, unspecified: Secondary | ICD-10-CM | POA: Diagnosis not present

## 2021-04-05 DIAGNOSIS — I10 Essential (primary) hypertension: Secondary | ICD-10-CM | POA: Diagnosis not present

## 2021-04-05 DIAGNOSIS — R600 Localized edema: Secondary | ICD-10-CM | POA: Diagnosis not present

## 2021-04-05 DIAGNOSIS — F32A Depression, unspecified: Secondary | ICD-10-CM | POA: Diagnosis not present

## 2021-04-05 DIAGNOSIS — I6529 Occlusion and stenosis of unspecified carotid artery: Secondary | ICD-10-CM | POA: Diagnosis not present

## 2021-04-05 DIAGNOSIS — N189 Chronic kidney disease, unspecified: Secondary | ICD-10-CM | POA: Diagnosis not present

## 2021-04-06 DIAGNOSIS — I10 Essential (primary) hypertension: Secondary | ICD-10-CM | POA: Diagnosis not present

## 2021-04-15 DIAGNOSIS — F32A Depression, unspecified: Secondary | ICD-10-CM | POA: Diagnosis not present

## 2021-04-15 DIAGNOSIS — F419 Anxiety disorder, unspecified: Secondary | ICD-10-CM | POA: Diagnosis not present

## 2021-04-15 DIAGNOSIS — R600 Localized edema: Secondary | ICD-10-CM | POA: Diagnosis not present

## 2021-04-15 DIAGNOSIS — I10 Essential (primary) hypertension: Secondary | ICD-10-CM | POA: Diagnosis not present

## 2021-04-17 DIAGNOSIS — S81801A Unspecified open wound, right lower leg, initial encounter: Secondary | ICD-10-CM | POA: Diagnosis not present

## 2021-04-23 DIAGNOSIS — I6529 Occlusion and stenosis of unspecified carotid artery: Secondary | ICD-10-CM | POA: Diagnosis not present

## 2021-04-23 DIAGNOSIS — N189 Chronic kidney disease, unspecified: Secondary | ICD-10-CM | POA: Diagnosis not present

## 2021-04-23 DIAGNOSIS — M1990 Unspecified osteoarthritis, unspecified site: Secondary | ICD-10-CM | POA: Diagnosis not present

## 2021-04-23 DIAGNOSIS — I10 Essential (primary) hypertension: Secondary | ICD-10-CM | POA: Diagnosis not present

## 2021-04-23 DIAGNOSIS — R600 Localized edema: Secondary | ICD-10-CM | POA: Diagnosis not present

## 2021-04-23 DIAGNOSIS — L989 Disorder of the skin and subcutaneous tissue, unspecified: Secondary | ICD-10-CM | POA: Diagnosis not present

## 2021-04-24 DIAGNOSIS — S81801D Unspecified open wound, right lower leg, subsequent encounter: Secondary | ICD-10-CM | POA: Diagnosis not present

## 2021-04-30 DIAGNOSIS — D510 Vitamin B12 deficiency anemia due to intrinsic factor deficiency: Secondary | ICD-10-CM | POA: Diagnosis not present

## 2021-04-30 DIAGNOSIS — I10 Essential (primary) hypertension: Secondary | ICD-10-CM | POA: Diagnosis not present

## 2021-04-30 DIAGNOSIS — E559 Vitamin D deficiency, unspecified: Secondary | ICD-10-CM | POA: Diagnosis not present

## 2021-04-30 DIAGNOSIS — E119 Type 2 diabetes mellitus without complications: Secondary | ICD-10-CM | POA: Diagnosis not present

## 2021-04-30 DIAGNOSIS — E785 Hyperlipidemia, unspecified: Secondary | ICD-10-CM | POA: Diagnosis not present

## 2021-05-01 DIAGNOSIS — I10 Essential (primary) hypertension: Secondary | ICD-10-CM | POA: Diagnosis not present

## 2021-05-01 DIAGNOSIS — S81801D Unspecified open wound, right lower leg, subsequent encounter: Secondary | ICD-10-CM | POA: Diagnosis not present

## 2021-05-01 DIAGNOSIS — M1990 Unspecified osteoarthritis, unspecified site: Secondary | ICD-10-CM | POA: Diagnosis not present

## 2021-05-01 DIAGNOSIS — N189 Chronic kidney disease, unspecified: Secondary | ICD-10-CM | POA: Diagnosis not present

## 2021-05-01 DIAGNOSIS — F329 Major depressive disorder, single episode, unspecified: Secondary | ICD-10-CM | POA: Diagnosis not present

## 2021-05-02 DIAGNOSIS — I10 Essential (primary) hypertension: Secondary | ICD-10-CM | POA: Diagnosis not present

## 2021-05-02 DIAGNOSIS — L603 Nail dystrophy: Secondary | ICD-10-CM | POA: Diagnosis not present

## 2021-05-02 DIAGNOSIS — M1990 Unspecified osteoarthritis, unspecified site: Secondary | ICD-10-CM | POA: Diagnosis not present

## 2021-05-02 DIAGNOSIS — R600 Localized edema: Secondary | ICD-10-CM | POA: Diagnosis not present

## 2021-05-02 DIAGNOSIS — I7091 Generalized atherosclerosis: Secondary | ICD-10-CM | POA: Diagnosis not present

## 2021-05-02 DIAGNOSIS — I6529 Occlusion and stenosis of unspecified carotid artery: Secondary | ICD-10-CM | POA: Diagnosis not present

## 2021-05-02 DIAGNOSIS — L989 Disorder of the skin and subcutaneous tissue, unspecified: Secondary | ICD-10-CM | POA: Diagnosis not present

## 2021-05-02 DIAGNOSIS — N189 Chronic kidney disease, unspecified: Secondary | ICD-10-CM | POA: Diagnosis not present

## 2021-05-02 DIAGNOSIS — L84 Corns and callosities: Secondary | ICD-10-CM | POA: Diagnosis not present

## 2021-05-02 DIAGNOSIS — B029 Zoster without complications: Secondary | ICD-10-CM | POA: Diagnosis not present

## 2021-05-13 DIAGNOSIS — I10 Essential (primary) hypertension: Secondary | ICD-10-CM | POA: Diagnosis not present

## 2021-05-13 DIAGNOSIS — F32A Depression, unspecified: Secondary | ICD-10-CM | POA: Diagnosis not present

## 2021-05-13 DIAGNOSIS — F419 Anxiety disorder, unspecified: Secondary | ICD-10-CM | POA: Diagnosis not present

## 2021-05-13 DIAGNOSIS — F600 Paranoid personality disorder: Secondary | ICD-10-CM | POA: Diagnosis not present

## 2021-05-21 DIAGNOSIS — I6529 Occlusion and stenosis of unspecified carotid artery: Secondary | ICD-10-CM | POA: Diagnosis not present

## 2021-05-21 DIAGNOSIS — M1990 Unspecified osteoarthritis, unspecified site: Secondary | ICD-10-CM | POA: Diagnosis not present

## 2021-05-21 DIAGNOSIS — F32A Depression, unspecified: Secondary | ICD-10-CM | POA: Diagnosis not present

## 2021-05-21 DIAGNOSIS — N189 Chronic kidney disease, unspecified: Secondary | ICD-10-CM | POA: Diagnosis not present

## 2021-05-21 DIAGNOSIS — L989 Disorder of the skin and subcutaneous tissue, unspecified: Secondary | ICD-10-CM | POA: Diagnosis not present

## 2021-05-21 DIAGNOSIS — R600 Localized edema: Secondary | ICD-10-CM | POA: Diagnosis not present

## 2021-05-21 DIAGNOSIS — F419 Anxiety disorder, unspecified: Secondary | ICD-10-CM | POA: Diagnosis not present

## 2021-05-21 DIAGNOSIS — I10 Essential (primary) hypertension: Secondary | ICD-10-CM | POA: Diagnosis not present

## 2021-05-28 DIAGNOSIS — M1990 Unspecified osteoarthritis, unspecified site: Secondary | ICD-10-CM | POA: Diagnosis not present

## 2021-05-28 DIAGNOSIS — N189 Chronic kidney disease, unspecified: Secondary | ICD-10-CM | POA: Diagnosis not present

## 2021-05-28 DIAGNOSIS — I10 Essential (primary) hypertension: Secondary | ICD-10-CM | POA: Diagnosis not present

## 2021-05-28 DIAGNOSIS — F329 Major depressive disorder, single episode, unspecified: Secondary | ICD-10-CM | POA: Diagnosis not present

## 2021-05-30 DIAGNOSIS — E0590 Thyrotoxicosis, unspecified without thyrotoxic crisis or storm: Secondary | ICD-10-CM | POA: Diagnosis not present

## 2021-06-07 DIAGNOSIS — I10 Essential (primary) hypertension: Secondary | ICD-10-CM | POA: Diagnosis not present

## 2021-06-20 DIAGNOSIS — D485 Neoplasm of uncertain behavior of skin: Secondary | ICD-10-CM | POA: Diagnosis not present

## 2021-06-20 DIAGNOSIS — L989 Disorder of the skin and subcutaneous tissue, unspecified: Secondary | ICD-10-CM | POA: Diagnosis not present

## 2021-06-20 DIAGNOSIS — F32A Depression, unspecified: Secondary | ICD-10-CM | POA: Diagnosis not present

## 2021-06-20 DIAGNOSIS — C44329 Squamous cell carcinoma of skin of other parts of face: Secondary | ICD-10-CM | POA: Diagnosis not present

## 2021-06-20 DIAGNOSIS — M1990 Unspecified osteoarthritis, unspecified site: Secondary | ICD-10-CM | POA: Diagnosis not present

## 2021-06-20 DIAGNOSIS — R600 Localized edema: Secondary | ICD-10-CM | POA: Diagnosis not present

## 2021-06-20 DIAGNOSIS — I6529 Occlusion and stenosis of unspecified carotid artery: Secondary | ICD-10-CM | POA: Diagnosis not present

## 2021-06-20 DIAGNOSIS — F419 Anxiety disorder, unspecified: Secondary | ICD-10-CM | POA: Diagnosis not present

## 2021-06-20 DIAGNOSIS — N189 Chronic kidney disease, unspecified: Secondary | ICD-10-CM | POA: Diagnosis not present

## 2021-06-21 DIAGNOSIS — I6529 Occlusion and stenosis of unspecified carotid artery: Secondary | ICD-10-CM | POA: Diagnosis not present

## 2021-06-21 DIAGNOSIS — M1990 Unspecified osteoarthritis, unspecified site: Secondary | ICD-10-CM | POA: Diagnosis not present

## 2021-06-21 DIAGNOSIS — I10 Essential (primary) hypertension: Secondary | ICD-10-CM | POA: Diagnosis not present

## 2021-06-21 DIAGNOSIS — N189 Chronic kidney disease, unspecified: Secondary | ICD-10-CM | POA: Diagnosis not present

## 2021-06-21 DIAGNOSIS — R600 Localized edema: Secondary | ICD-10-CM | POA: Diagnosis not present

## 2021-06-21 DIAGNOSIS — L989 Disorder of the skin and subcutaneous tissue, unspecified: Secondary | ICD-10-CM | POA: Diagnosis not present

## 2021-06-28 DIAGNOSIS — R946 Abnormal results of thyroid function studies: Secondary | ICD-10-CM | POA: Diagnosis not present

## 2021-07-03 DIAGNOSIS — M1990 Unspecified osteoarthritis, unspecified site: Secondary | ICD-10-CM | POA: Diagnosis not present

## 2021-07-03 DIAGNOSIS — N189 Chronic kidney disease, unspecified: Secondary | ICD-10-CM | POA: Diagnosis not present

## 2021-07-03 DIAGNOSIS — I10 Essential (primary) hypertension: Secondary | ICD-10-CM | POA: Diagnosis not present

## 2021-07-03 DIAGNOSIS — F329 Major depressive disorder, single episode, unspecified: Secondary | ICD-10-CM | POA: Diagnosis not present

## 2021-07-06 DIAGNOSIS — I10 Essential (primary) hypertension: Secondary | ICD-10-CM | POA: Diagnosis not present

## 2021-07-07 DIAGNOSIS — R296 Repeated falls: Secondary | ICD-10-CM | POA: Diagnosis not present

## 2021-07-18 DIAGNOSIS — I6529 Occlusion and stenosis of unspecified carotid artery: Secondary | ICD-10-CM | POA: Diagnosis not present

## 2021-07-18 DIAGNOSIS — M1990 Unspecified osteoarthritis, unspecified site: Secondary | ICD-10-CM | POA: Diagnosis not present

## 2021-07-18 DIAGNOSIS — B372 Candidiasis of skin and nail: Secondary | ICD-10-CM | POA: Diagnosis not present

## 2021-07-18 DIAGNOSIS — N189 Chronic kidney disease, unspecified: Secondary | ICD-10-CM | POA: Diagnosis not present

## 2021-07-18 DIAGNOSIS — R600 Localized edema: Secondary | ICD-10-CM | POA: Diagnosis not present

## 2021-07-18 DIAGNOSIS — F32A Depression, unspecified: Secondary | ICD-10-CM | POA: Diagnosis not present

## 2021-07-18 DIAGNOSIS — I10 Essential (primary) hypertension: Secondary | ICD-10-CM | POA: Diagnosis not present

## 2021-07-28 DIAGNOSIS — I10 Essential (primary) hypertension: Secondary | ICD-10-CM | POA: Diagnosis not present

## 2021-07-30 DIAGNOSIS — F329 Major depressive disorder, single episode, unspecified: Secondary | ICD-10-CM | POA: Diagnosis not present

## 2021-07-30 DIAGNOSIS — N189 Chronic kidney disease, unspecified: Secondary | ICD-10-CM | POA: Diagnosis not present

## 2021-07-30 DIAGNOSIS — I10 Essential (primary) hypertension: Secondary | ICD-10-CM | POA: Diagnosis not present

## 2021-07-30 DIAGNOSIS — M1990 Unspecified osteoarthritis, unspecified site: Secondary | ICD-10-CM | POA: Diagnosis not present

## 2021-08-06 DIAGNOSIS — I10 Essential (primary) hypertension: Secondary | ICD-10-CM | POA: Diagnosis not present

## 2021-08-20 DIAGNOSIS — I10 Essential (primary) hypertension: Secondary | ICD-10-CM | POA: Diagnosis not present

## 2021-08-20 DIAGNOSIS — I6529 Occlusion and stenosis of unspecified carotid artery: Secondary | ICD-10-CM | POA: Diagnosis not present

## 2021-08-20 DIAGNOSIS — U071 COVID-19: Secondary | ICD-10-CM | POA: Diagnosis not present

## 2021-08-20 DIAGNOSIS — N189 Chronic kidney disease, unspecified: Secondary | ICD-10-CM | POA: Diagnosis not present

## 2021-08-20 DIAGNOSIS — M1990 Unspecified osteoarthritis, unspecified site: Secondary | ICD-10-CM | POA: Diagnosis not present

## 2021-08-20 DIAGNOSIS — R600 Localized edema: Secondary | ICD-10-CM | POA: Diagnosis not present

## 2021-08-20 DIAGNOSIS — L989 Disorder of the skin and subcutaneous tissue, unspecified: Secondary | ICD-10-CM | POA: Diagnosis not present

## 2021-08-22 DIAGNOSIS — R296 Repeated falls: Secondary | ICD-10-CM | POA: Diagnosis not present

## 2021-08-28 DIAGNOSIS — I10 Essential (primary) hypertension: Secondary | ICD-10-CM | POA: Diagnosis not present

## 2021-08-29 DIAGNOSIS — R600 Localized edema: Secondary | ICD-10-CM | POA: Diagnosis not present

## 2021-08-29 DIAGNOSIS — R5381 Other malaise: Secondary | ICD-10-CM | POA: Diagnosis not present

## 2021-08-29 DIAGNOSIS — N189 Chronic kidney disease, unspecified: Secondary | ICD-10-CM | POA: Diagnosis not present

## 2021-08-29 DIAGNOSIS — I10 Essential (primary) hypertension: Secondary | ICD-10-CM | POA: Diagnosis not present

## 2021-08-29 DIAGNOSIS — M1990 Unspecified osteoarthritis, unspecified site: Secondary | ICD-10-CM | POA: Diagnosis not present

## 2021-08-29 DIAGNOSIS — L989 Disorder of the skin and subcutaneous tissue, unspecified: Secondary | ICD-10-CM | POA: Diagnosis not present

## 2021-08-29 DIAGNOSIS — B372 Candidiasis of skin and nail: Secondary | ICD-10-CM | POA: Diagnosis not present

## 2021-08-30 DIAGNOSIS — B372 Candidiasis of skin and nail: Secondary | ICD-10-CM | POA: Diagnosis not present

## 2021-08-30 DIAGNOSIS — I10 Essential (primary) hypertension: Secondary | ICD-10-CM | POA: Diagnosis not present

## 2021-08-30 DIAGNOSIS — N189 Chronic kidney disease, unspecified: Secondary | ICD-10-CM | POA: Diagnosis not present

## 2021-08-30 DIAGNOSIS — F32A Depression, unspecified: Secondary | ICD-10-CM | POA: Diagnosis not present

## 2021-08-30 DIAGNOSIS — L989 Disorder of the skin and subcutaneous tissue, unspecified: Secondary | ICD-10-CM | POA: Diagnosis not present

## 2021-08-30 DIAGNOSIS — M1990 Unspecified osteoarthritis, unspecified site: Secondary | ICD-10-CM | POA: Diagnosis not present

## 2021-08-30 DIAGNOSIS — R5381 Other malaise: Secondary | ICD-10-CM | POA: Diagnosis not present

## 2021-08-30 DIAGNOSIS — R600 Localized edema: Secondary | ICD-10-CM | POA: Diagnosis not present

## 2021-09-03 DIAGNOSIS — I10 Essential (primary) hypertension: Secondary | ICD-10-CM | POA: Diagnosis not present

## 2021-09-04 DIAGNOSIS — I10 Essential (primary) hypertension: Secondary | ICD-10-CM | POA: Diagnosis not present

## 2021-09-04 DIAGNOSIS — M1990 Unspecified osteoarthritis, unspecified site: Secondary | ICD-10-CM | POA: Diagnosis not present

## 2021-09-04 DIAGNOSIS — F329 Major depressive disorder, single episode, unspecified: Secondary | ICD-10-CM | POA: Diagnosis not present

## 2021-09-04 DIAGNOSIS — N189 Chronic kidney disease, unspecified: Secondary | ICD-10-CM | POA: Diagnosis not present

## 2021-09-19 DIAGNOSIS — N189 Chronic kidney disease, unspecified: Secondary | ICD-10-CM | POA: Diagnosis not present

## 2021-09-19 DIAGNOSIS — I10 Essential (primary) hypertension: Secondary | ICD-10-CM | POA: Diagnosis not present

## 2021-09-19 DIAGNOSIS — R5381 Other malaise: Secondary | ICD-10-CM | POA: Diagnosis not present

## 2021-09-19 DIAGNOSIS — I6529 Occlusion and stenosis of unspecified carotid artery: Secondary | ICD-10-CM | POA: Diagnosis not present

## 2021-09-19 DIAGNOSIS — L989 Disorder of the skin and subcutaneous tissue, unspecified: Secondary | ICD-10-CM | POA: Diagnosis not present

## 2021-09-19 DIAGNOSIS — M1990 Unspecified osteoarthritis, unspecified site: Secondary | ICD-10-CM | POA: Diagnosis not present

## 2021-09-20 DIAGNOSIS — M1990 Unspecified osteoarthritis, unspecified site: Secondary | ICD-10-CM | POA: Diagnosis not present

## 2021-09-20 DIAGNOSIS — I10 Essential (primary) hypertension: Secondary | ICD-10-CM | POA: Diagnosis not present

## 2021-09-20 DIAGNOSIS — F329 Major depressive disorder, single episode, unspecified: Secondary | ICD-10-CM | POA: Diagnosis not present

## 2021-09-20 DIAGNOSIS — N189 Chronic kidney disease, unspecified: Secondary | ICD-10-CM | POA: Diagnosis not present

## 2021-09-21 DIAGNOSIS — I7091 Generalized atherosclerosis: Secondary | ICD-10-CM | POA: Diagnosis not present

## 2021-09-26 DIAGNOSIS — E0590 Thyrotoxicosis, unspecified without thyrotoxic crisis or storm: Secondary | ICD-10-CM | POA: Diagnosis not present

## 2021-10-04 DIAGNOSIS — I10 Essential (primary) hypertension: Secondary | ICD-10-CM | POA: Diagnosis not present

## 2021-10-15 DIAGNOSIS — N189 Chronic kidney disease, unspecified: Secondary | ICD-10-CM | POA: Diagnosis not present

## 2021-10-15 DIAGNOSIS — I10 Essential (primary) hypertension: Secondary | ICD-10-CM | POA: Diagnosis not present

## 2021-10-15 DIAGNOSIS — R5381 Other malaise: Secondary | ICD-10-CM | POA: Diagnosis not present

## 2021-10-15 DIAGNOSIS — R600 Localized edema: Secondary | ICD-10-CM | POA: Diagnosis not present

## 2021-10-15 DIAGNOSIS — F419 Anxiety disorder, unspecified: Secondary | ICD-10-CM | POA: Diagnosis not present

## 2021-10-15 DIAGNOSIS — S61411A Laceration without foreign body of right hand, initial encounter: Secondary | ICD-10-CM | POA: Diagnosis not present

## 2021-10-15 DIAGNOSIS — I6529 Occlusion and stenosis of unspecified carotid artery: Secondary | ICD-10-CM | POA: Diagnosis not present

## 2021-10-15 DIAGNOSIS — M1990 Unspecified osteoarthritis, unspecified site: Secondary | ICD-10-CM | POA: Diagnosis not present

## 2021-10-22 DIAGNOSIS — I6529 Occlusion and stenosis of unspecified carotid artery: Secondary | ICD-10-CM | POA: Diagnosis not present

## 2021-10-22 DIAGNOSIS — R600 Localized edema: Secondary | ICD-10-CM | POA: Diagnosis not present

## 2021-10-22 DIAGNOSIS — J029 Acute pharyngitis, unspecified: Secondary | ICD-10-CM | POA: Diagnosis not present

## 2021-10-22 DIAGNOSIS — N189 Chronic kidney disease, unspecified: Secondary | ICD-10-CM | POA: Diagnosis not present

## 2021-10-22 DIAGNOSIS — F32A Depression, unspecified: Secondary | ICD-10-CM | POA: Diagnosis not present

## 2021-10-22 DIAGNOSIS — R5381 Other malaise: Secondary | ICD-10-CM | POA: Diagnosis not present

## 2021-10-22 DIAGNOSIS — I10 Essential (primary) hypertension: Secondary | ICD-10-CM | POA: Diagnosis not present

## 2021-10-22 DIAGNOSIS — M1990 Unspecified osteoarthritis, unspecified site: Secondary | ICD-10-CM | POA: Diagnosis not present

## 2021-10-23 DIAGNOSIS — S61202A Unspecified open wound of right middle finger without damage to nail, initial encounter: Secondary | ICD-10-CM | POA: Diagnosis not present

## 2021-10-27 DIAGNOSIS — I10 Essential (primary) hypertension: Secondary | ICD-10-CM | POA: Diagnosis not present

## 2021-10-27 DIAGNOSIS — R1012 Left upper quadrant pain: Secondary | ICD-10-CM | POA: Diagnosis not present

## 2021-10-27 DIAGNOSIS — E559 Vitamin D deficiency, unspecified: Secondary | ICD-10-CM | POA: Diagnosis not present

## 2021-10-27 DIAGNOSIS — N1831 Chronic kidney disease, stage 3a: Secondary | ICD-10-CM | POA: Diagnosis not present

## 2021-10-28 DIAGNOSIS — R109 Unspecified abdominal pain: Secondary | ICD-10-CM | POA: Diagnosis not present

## 2021-10-29 DIAGNOSIS — N189 Chronic kidney disease, unspecified: Secondary | ICD-10-CM | POA: Diagnosis not present

## 2021-10-29 DIAGNOSIS — I6529 Occlusion and stenosis of unspecified carotid artery: Secondary | ICD-10-CM | POA: Diagnosis not present

## 2021-10-29 DIAGNOSIS — F32A Depression, unspecified: Secondary | ICD-10-CM | POA: Diagnosis not present

## 2021-10-29 DIAGNOSIS — R600 Localized edema: Secondary | ICD-10-CM | POA: Diagnosis not present

## 2021-10-29 DIAGNOSIS — F419 Anxiety disorder, unspecified: Secondary | ICD-10-CM | POA: Diagnosis not present

## 2021-10-29 DIAGNOSIS — M1990 Unspecified osteoarthritis, unspecified site: Secondary | ICD-10-CM | POA: Diagnosis not present

## 2021-10-29 DIAGNOSIS — J029 Acute pharyngitis, unspecified: Secondary | ICD-10-CM | POA: Diagnosis not present

## 2021-10-29 DIAGNOSIS — I10 Essential (primary) hypertension: Secondary | ICD-10-CM | POA: Diagnosis not present

## 2021-10-30 DIAGNOSIS — S61202A Unspecified open wound of right middle finger without damage to nail, initial encounter: Secondary | ICD-10-CM | POA: Diagnosis not present

## 2021-11-01 DIAGNOSIS — J029 Acute pharyngitis, unspecified: Secondary | ICD-10-CM | POA: Diagnosis not present

## 2021-11-01 DIAGNOSIS — N189 Chronic kidney disease, unspecified: Secondary | ICD-10-CM | POA: Diagnosis not present

## 2021-11-01 DIAGNOSIS — I6529 Occlusion and stenosis of unspecified carotid artery: Secondary | ICD-10-CM | POA: Diagnosis not present

## 2021-11-01 DIAGNOSIS — R100 Acute abdomen: Secondary | ICD-10-CM | POA: Diagnosis not present

## 2021-11-01 DIAGNOSIS — R600 Localized edema: Secondary | ICD-10-CM | POA: Diagnosis not present

## 2021-11-01 DIAGNOSIS — F419 Anxiety disorder, unspecified: Secondary | ICD-10-CM | POA: Diagnosis not present

## 2021-11-01 DIAGNOSIS — M1990 Unspecified osteoarthritis, unspecified site: Secondary | ICD-10-CM | POA: Diagnosis not present

## 2021-11-01 DIAGNOSIS — F32A Depression, unspecified: Secondary | ICD-10-CM | POA: Diagnosis not present

## 2021-11-01 DIAGNOSIS — I10 Essential (primary) hypertension: Secondary | ICD-10-CM | POA: Diagnosis not present

## 2021-11-01 DIAGNOSIS — R5381 Other malaise: Secondary | ICD-10-CM | POA: Diagnosis not present

## 2021-11-03 DIAGNOSIS — I10 Essential (primary) hypertension: Secondary | ICD-10-CM | POA: Diagnosis not present

## 2021-11-05 DIAGNOSIS — I10 Essential (primary) hypertension: Secondary | ICD-10-CM | POA: Diagnosis not present

## 2021-11-05 DIAGNOSIS — F329 Major depressive disorder, single episode, unspecified: Secondary | ICD-10-CM | POA: Diagnosis not present

## 2021-11-05 DIAGNOSIS — N189 Chronic kidney disease, unspecified: Secondary | ICD-10-CM | POA: Diagnosis not present

## 2021-11-05 DIAGNOSIS — M1990 Unspecified osteoarthritis, unspecified site: Secondary | ICD-10-CM | POA: Diagnosis not present

## 2021-11-06 DIAGNOSIS — S61202A Unspecified open wound of right middle finger without damage to nail, initial encounter: Secondary | ICD-10-CM | POA: Diagnosis not present

## 2021-11-12 DIAGNOSIS — M1990 Unspecified osteoarthritis, unspecified site: Secondary | ICD-10-CM | POA: Diagnosis not present

## 2021-11-12 DIAGNOSIS — R5381 Other malaise: Secondary | ICD-10-CM | POA: Diagnosis not present

## 2021-11-12 DIAGNOSIS — R600 Localized edema: Secondary | ICD-10-CM | POA: Diagnosis not present

## 2021-11-12 DIAGNOSIS — F32A Depression, unspecified: Secondary | ICD-10-CM | POA: Diagnosis not present

## 2021-11-12 DIAGNOSIS — I6529 Occlusion and stenosis of unspecified carotid artery: Secondary | ICD-10-CM | POA: Diagnosis not present

## 2021-11-12 DIAGNOSIS — F419 Anxiety disorder, unspecified: Secondary | ICD-10-CM | POA: Diagnosis not present

## 2021-11-12 DIAGNOSIS — N189 Chronic kidney disease, unspecified: Secondary | ICD-10-CM | POA: Diagnosis not present

## 2021-11-13 DIAGNOSIS — S61202A Unspecified open wound of right middle finger without damage to nail, initial encounter: Secondary | ICD-10-CM | POA: Diagnosis not present

## 2021-11-21 DIAGNOSIS — I7091 Generalized atherosclerosis: Secondary | ICD-10-CM | POA: Diagnosis not present

## 2021-11-26 DIAGNOSIS — E0590 Thyrotoxicosis, unspecified without thyrotoxic crisis or storm: Secondary | ICD-10-CM | POA: Diagnosis not present

## 2021-11-27 DIAGNOSIS — F329 Major depressive disorder, single episode, unspecified: Secondary | ICD-10-CM | POA: Diagnosis not present

## 2021-11-27 DIAGNOSIS — I10 Essential (primary) hypertension: Secondary | ICD-10-CM | POA: Diagnosis not present

## 2021-11-27 DIAGNOSIS — N189 Chronic kidney disease, unspecified: Secondary | ICD-10-CM | POA: Diagnosis not present

## 2021-11-27 DIAGNOSIS — M1990 Unspecified osteoarthritis, unspecified site: Secondary | ICD-10-CM | POA: Diagnosis not present

## 2021-12-04 DIAGNOSIS — I10 Essential (primary) hypertension: Secondary | ICD-10-CM | POA: Diagnosis not present

## 2021-12-10 DIAGNOSIS — R600 Localized edema: Secondary | ICD-10-CM | POA: Diagnosis not present

## 2021-12-10 DIAGNOSIS — M1990 Unspecified osteoarthritis, unspecified site: Secondary | ICD-10-CM | POA: Diagnosis not present

## 2021-12-10 DIAGNOSIS — N189 Chronic kidney disease, unspecified: Secondary | ICD-10-CM | POA: Diagnosis not present

## 2021-12-10 DIAGNOSIS — I6529 Occlusion and stenosis of unspecified carotid artery: Secondary | ICD-10-CM | POA: Diagnosis not present

## 2021-12-10 DIAGNOSIS — I10 Essential (primary) hypertension: Secondary | ICD-10-CM | POA: Diagnosis not present

## 2021-12-10 DIAGNOSIS — F32A Depression, unspecified: Secondary | ICD-10-CM | POA: Diagnosis not present

## 2021-12-11 DIAGNOSIS — R52 Pain, unspecified: Secondary | ICD-10-CM | POA: Diagnosis not present

## 2021-12-17 DIAGNOSIS — I10 Essential (primary) hypertension: Secondary | ICD-10-CM | POA: Diagnosis not present

## 2021-12-18 DIAGNOSIS — F329 Major depressive disorder, single episode, unspecified: Secondary | ICD-10-CM | POA: Diagnosis not present

## 2021-12-18 DIAGNOSIS — M1990 Unspecified osteoarthritis, unspecified site: Secondary | ICD-10-CM | POA: Diagnosis not present

## 2021-12-18 DIAGNOSIS — N189 Chronic kidney disease, unspecified: Secondary | ICD-10-CM | POA: Diagnosis not present

## 2021-12-18 DIAGNOSIS — I10 Essential (primary) hypertension: Secondary | ICD-10-CM | POA: Diagnosis not present

## 2021-12-27 DIAGNOSIS — I10 Essential (primary) hypertension: Secondary | ICD-10-CM | POA: Diagnosis not present

## 2021-12-27 DIAGNOSIS — F32A Depression, unspecified: Secondary | ICD-10-CM | POA: Diagnosis not present

## 2021-12-27 DIAGNOSIS — R600 Localized edema: Secondary | ICD-10-CM | POA: Diagnosis not present

## 2021-12-27 DIAGNOSIS — M1990 Unspecified osteoarthritis, unspecified site: Secondary | ICD-10-CM | POA: Diagnosis not present

## 2021-12-27 DIAGNOSIS — N189 Chronic kidney disease, unspecified: Secondary | ICD-10-CM | POA: Diagnosis not present

## 2021-12-27 DIAGNOSIS — I6529 Occlusion and stenosis of unspecified carotid artery: Secondary | ICD-10-CM | POA: Diagnosis not present

## 2022-01-03 DIAGNOSIS — I10 Essential (primary) hypertension: Secondary | ICD-10-CM | POA: Diagnosis not present

## 2022-01-07 DIAGNOSIS — F32A Depression, unspecified: Secondary | ICD-10-CM | POA: Diagnosis not present

## 2022-01-07 DIAGNOSIS — R600 Localized edema: Secondary | ICD-10-CM | POA: Diagnosis not present

## 2022-01-07 DIAGNOSIS — I10 Essential (primary) hypertension: Secondary | ICD-10-CM | POA: Diagnosis not present

## 2022-01-07 DIAGNOSIS — R079 Chest pain, unspecified: Secondary | ICD-10-CM | POA: Diagnosis not present

## 2022-01-07 DIAGNOSIS — F419 Anxiety disorder, unspecified: Secondary | ICD-10-CM | POA: Diagnosis not present

## 2022-01-07 DIAGNOSIS — N189 Chronic kidney disease, unspecified: Secondary | ICD-10-CM | POA: Diagnosis not present

## 2022-01-07 DIAGNOSIS — I6529 Occlusion and stenosis of unspecified carotid artery: Secondary | ICD-10-CM | POA: Diagnosis not present

## 2022-01-07 DIAGNOSIS — M1990 Unspecified osteoarthritis, unspecified site: Secondary | ICD-10-CM | POA: Diagnosis not present

## 2022-01-16 DIAGNOSIS — Z23 Encounter for immunization: Secondary | ICD-10-CM | POA: Diagnosis not present

## 2022-01-28 DIAGNOSIS — I6529 Occlusion and stenosis of unspecified carotid artery: Secondary | ICD-10-CM | POA: Diagnosis not present

## 2022-01-28 DIAGNOSIS — M1990 Unspecified osteoarthritis, unspecified site: Secondary | ICD-10-CM | POA: Diagnosis not present

## 2022-01-28 DIAGNOSIS — N189 Chronic kidney disease, unspecified: Secondary | ICD-10-CM | POA: Diagnosis not present

## 2022-01-28 DIAGNOSIS — I10 Essential (primary) hypertension: Secondary | ICD-10-CM | POA: Diagnosis not present

## 2022-01-28 DIAGNOSIS — R600 Localized edema: Secondary | ICD-10-CM | POA: Diagnosis not present

## 2022-01-28 DIAGNOSIS — E039 Hypothyroidism, unspecified: Secondary | ICD-10-CM | POA: Diagnosis not present

## 2022-01-29 DIAGNOSIS — I7091 Generalized atherosclerosis: Secondary | ICD-10-CM | POA: Diagnosis not present

## 2022-01-29 DIAGNOSIS — B351 Tinea unguium: Secondary | ICD-10-CM | POA: Diagnosis not present

## 2022-02-06 DIAGNOSIS — I10 Essential (primary) hypertension: Secondary | ICD-10-CM | POA: Diagnosis not present

## 2022-02-06 DIAGNOSIS — N189 Chronic kidney disease, unspecified: Secondary | ICD-10-CM | POA: Diagnosis not present

## 2022-02-06 DIAGNOSIS — F329 Major depressive disorder, single episode, unspecified: Secondary | ICD-10-CM | POA: Diagnosis not present

## 2022-02-06 DIAGNOSIS — M1990 Unspecified osteoarthritis, unspecified site: Secondary | ICD-10-CM | POA: Diagnosis not present

## 2022-02-22 DIAGNOSIS — F329 Major depressive disorder, single episode, unspecified: Secondary | ICD-10-CM | POA: Diagnosis not present

## 2022-02-22 DIAGNOSIS — M1990 Unspecified osteoarthritis, unspecified site: Secondary | ICD-10-CM | POA: Diagnosis not present

## 2022-02-22 DIAGNOSIS — I10 Essential (primary) hypertension: Secondary | ICD-10-CM | POA: Diagnosis not present

## 2022-02-22 DIAGNOSIS — N189 Chronic kidney disease, unspecified: Secondary | ICD-10-CM | POA: Diagnosis not present

## 2022-02-28 DIAGNOSIS — E039 Hypothyroidism, unspecified: Secondary | ICD-10-CM | POA: Diagnosis not present

## 2022-03-05 DIAGNOSIS — M1990 Unspecified osteoarthritis, unspecified site: Secondary | ICD-10-CM | POA: Diagnosis not present

## 2022-03-05 DIAGNOSIS — I6529 Occlusion and stenosis of unspecified carotid artery: Secondary | ICD-10-CM | POA: Diagnosis not present

## 2022-03-05 DIAGNOSIS — R600 Localized edema: Secondary | ICD-10-CM | POA: Diagnosis not present

## 2022-03-05 DIAGNOSIS — I10 Essential (primary) hypertension: Secondary | ICD-10-CM | POA: Diagnosis not present

## 2022-03-05 DIAGNOSIS — N189 Chronic kidney disease, unspecified: Secondary | ICD-10-CM | POA: Diagnosis not present

## 2022-03-05 DIAGNOSIS — F32A Depression, unspecified: Secondary | ICD-10-CM | POA: Diagnosis not present

## 2022-03-06 DIAGNOSIS — I10 Essential (primary) hypertension: Secondary | ICD-10-CM | POA: Diagnosis not present

## 2022-03-06 DIAGNOSIS — R600 Localized edema: Secondary | ICD-10-CM | POA: Diagnosis not present

## 2022-03-06 DIAGNOSIS — R0602 Shortness of breath: Secondary | ICD-10-CM | POA: Diagnosis not present

## 2022-03-06 DIAGNOSIS — N189 Chronic kidney disease, unspecified: Secondary | ICD-10-CM | POA: Diagnosis not present

## 2022-03-06 DIAGNOSIS — I6529 Occlusion and stenosis of unspecified carotid artery: Secondary | ICD-10-CM | POA: Diagnosis not present

## 2022-03-06 DIAGNOSIS — M1990 Unspecified osteoarthritis, unspecified site: Secondary | ICD-10-CM | POA: Diagnosis not present

## 2022-03-18 DIAGNOSIS — N189 Chronic kidney disease, unspecified: Secondary | ICD-10-CM | POA: Diagnosis not present

## 2022-03-18 DIAGNOSIS — F329 Major depressive disorder, single episode, unspecified: Secondary | ICD-10-CM | POA: Diagnosis not present

## 2022-03-18 DIAGNOSIS — M1990 Unspecified osteoarthritis, unspecified site: Secondary | ICD-10-CM | POA: Diagnosis not present

## 2022-03-18 DIAGNOSIS — I10 Essential (primary) hypertension: Secondary | ICD-10-CM | POA: Diagnosis not present

## 2022-03-20 DIAGNOSIS — M1990 Unspecified osteoarthritis, unspecified site: Secondary | ICD-10-CM | POA: Diagnosis not present

## 2022-03-20 DIAGNOSIS — F32A Depression, unspecified: Secondary | ICD-10-CM | POA: Diagnosis not present

## 2022-03-20 DIAGNOSIS — I10 Essential (primary) hypertension: Secondary | ICD-10-CM | POA: Diagnosis not present

## 2022-03-20 DIAGNOSIS — F419 Anxiety disorder, unspecified: Secondary | ICD-10-CM | POA: Diagnosis not present

## 2022-03-20 DIAGNOSIS — I6529 Occlusion and stenosis of unspecified carotid artery: Secondary | ICD-10-CM | POA: Diagnosis not present

## 2022-03-20 DIAGNOSIS — N189 Chronic kidney disease, unspecified: Secondary | ICD-10-CM | POA: Diagnosis not present

## 2022-03-20 DIAGNOSIS — R600 Localized edema: Secondary | ICD-10-CM | POA: Diagnosis not present

## 2022-03-25 DIAGNOSIS — M25511 Pain in right shoulder: Secondary | ICD-10-CM | POA: Diagnosis not present

## 2022-03-25 DIAGNOSIS — M79601 Pain in right arm: Secondary | ICD-10-CM | POA: Diagnosis not present

## 2022-04-01 DIAGNOSIS — I6529 Occlusion and stenosis of unspecified carotid artery: Secondary | ICD-10-CM | POA: Diagnosis not present

## 2022-04-01 DIAGNOSIS — M1990 Unspecified osteoarthritis, unspecified site: Secondary | ICD-10-CM | POA: Diagnosis not present

## 2022-04-01 DIAGNOSIS — I10 Essential (primary) hypertension: Secondary | ICD-10-CM | POA: Diagnosis not present

## 2022-04-01 DIAGNOSIS — R262 Difficulty in walking, not elsewhere classified: Secondary | ICD-10-CM | POA: Diagnosis not present

## 2022-04-01 DIAGNOSIS — N189 Chronic kidney disease, unspecified: Secondary | ICD-10-CM | POA: Diagnosis not present

## 2022-04-01 DIAGNOSIS — R600 Localized edema: Secondary | ICD-10-CM | POA: Diagnosis not present

## 2022-04-01 DIAGNOSIS — R5381 Other malaise: Secondary | ICD-10-CM | POA: Diagnosis not present

## 2022-04-02 DIAGNOSIS — R011 Cardiac murmur, unspecified: Secondary | ICD-10-CM | POA: Diagnosis not present

## 2022-04-04 DIAGNOSIS — I6523 Occlusion and stenosis of bilateral carotid arteries: Secondary | ICD-10-CM | POA: Diagnosis not present

## 2022-04-04 DIAGNOSIS — R0989 Other specified symptoms and signs involving the circulatory and respiratory systems: Secondary | ICD-10-CM | POA: Diagnosis not present

## 2022-04-08 DIAGNOSIS — I70223 Atherosclerosis of native arteries of extremities with rest pain, bilateral legs: Secondary | ICD-10-CM | POA: Diagnosis not present

## 2022-04-11 DIAGNOSIS — I7091 Generalized atherosclerosis: Secondary | ICD-10-CM | POA: Diagnosis not present

## 2022-04-11 DIAGNOSIS — B351 Tinea unguium: Secondary | ICD-10-CM | POA: Diagnosis not present

## 2022-04-11 DIAGNOSIS — I77811 Abdominal aortic ectasia: Secondary | ICD-10-CM | POA: Diagnosis not present

## 2022-04-15 DIAGNOSIS — R590 Localized enlarged lymph nodes: Secondary | ICD-10-CM | POA: Diagnosis not present

## 2022-04-18 DIAGNOSIS — R2242 Localized swelling, mass and lump, left lower limb: Secondary | ICD-10-CM | POA: Diagnosis not present

## 2022-04-20 ENCOUNTER — Other Ambulatory Visit: Payer: Self-pay

## 2022-04-20 ENCOUNTER — Emergency Department (HOSPITAL_COMMUNITY): Payer: Medicare Other

## 2022-04-20 ENCOUNTER — Inpatient Hospital Stay (HOSPITAL_COMMUNITY)
Admission: EM | Admit: 2022-04-20 | Discharge: 2022-04-22 | DRG: 563 | Disposition: A | Discharge status: Skilled Nursing Facility | Payer: Medicare Other | Source: Skilled Nursing Facility | Attending: Internal Medicine | Admitting: Internal Medicine

## 2022-04-20 ENCOUNTER — Encounter (HOSPITAL_COMMUNITY): Payer: Self-pay

## 2022-04-20 DIAGNOSIS — Z800 Family history of malignant neoplasm of digestive organs: Secondary | ICD-10-CM | POA: Diagnosis not present

## 2022-04-20 DIAGNOSIS — I10 Essential (primary) hypertension: Secondary | ICD-10-CM | POA: Diagnosis present

## 2022-04-20 DIAGNOSIS — E871 Hypo-osmolality and hyponatremia: Secondary | ICD-10-CM | POA: Diagnosis not present

## 2022-04-20 DIAGNOSIS — F0390 Unspecified dementia without behavioral disturbance: Secondary | ICD-10-CM | POA: Diagnosis present

## 2022-04-20 DIAGNOSIS — S42202A Unspecified fracture of upper end of left humerus, initial encounter for closed fracture: Secondary | ICD-10-CM | POA: Diagnosis present

## 2022-04-20 DIAGNOSIS — K219 Gastro-esophageal reflux disease without esophagitis: Secondary | ICD-10-CM | POA: Diagnosis present

## 2022-04-20 DIAGNOSIS — I959 Hypotension, unspecified: Secondary | ICD-10-CM | POA: Diagnosis not present

## 2022-04-20 DIAGNOSIS — S52602A Unspecified fracture of lower end of left ulna, initial encounter for closed fracture: Secondary | ICD-10-CM | POA: Diagnosis present

## 2022-04-20 DIAGNOSIS — S52532A Colles' fracture of left radius, initial encounter for closed fracture: Secondary | ICD-10-CM | POA: Diagnosis not present

## 2022-04-20 DIAGNOSIS — D649 Anemia, unspecified: Secondary | ICD-10-CM | POA: Diagnosis present

## 2022-04-20 DIAGNOSIS — Z7401 Bed confinement status: Secondary | ICD-10-CM | POA: Diagnosis not present

## 2022-04-20 DIAGNOSIS — Z7982 Long term (current) use of aspirin: Secondary | ICD-10-CM

## 2022-04-20 DIAGNOSIS — S52002A Unspecified fracture of upper end of left ulna, initial encounter for closed fracture: Secondary | ICD-10-CM | POA: Diagnosis present

## 2022-04-20 DIAGNOSIS — H9193 Unspecified hearing loss, bilateral: Secondary | ICD-10-CM | POA: Diagnosis present

## 2022-04-20 DIAGNOSIS — Y92091 Bathroom in other non-institutional residence as the place of occurrence of the external cause: Secondary | ICD-10-CM | POA: Diagnosis not present

## 2022-04-20 DIAGNOSIS — W19XXXA Unspecified fall, initial encounter: Secondary | ICD-10-CM | POA: Diagnosis not present

## 2022-04-20 DIAGNOSIS — F32A Depression, unspecified: Secondary | ICD-10-CM | POA: Diagnosis present

## 2022-04-20 DIAGNOSIS — Z8542 Personal history of malignant neoplasm of other parts of uterus: Secondary | ICD-10-CM

## 2022-04-20 DIAGNOSIS — S42402A Unspecified fracture of lower end of left humerus, initial encounter for closed fracture: Secondary | ICD-10-CM | POA: Diagnosis not present

## 2022-04-20 DIAGNOSIS — R4182 Altered mental status, unspecified: Secondary | ICD-10-CM | POA: Diagnosis not present

## 2022-04-20 DIAGNOSIS — W1830XA Fall on same level, unspecified, initial encounter: Secondary | ICD-10-CM | POA: Diagnosis present

## 2022-04-20 DIAGNOSIS — Z66 Do not resuscitate: Secondary | ICD-10-CM | POA: Diagnosis present

## 2022-04-20 DIAGNOSIS — S52692A Other fracture of lower end of left ulna, initial encounter for closed fracture: Secondary | ICD-10-CM | POA: Diagnosis not present

## 2022-04-20 DIAGNOSIS — S52502A Unspecified fracture of the lower end of left radius, initial encounter for closed fracture: Secondary | ICD-10-CM

## 2022-04-20 DIAGNOSIS — F03C0 Unspecified dementia, severe, without behavioral disturbance, psychotic disturbance, mood disturbance, and anxiety: Secondary | ICD-10-CM | POA: Diagnosis present

## 2022-04-20 DIAGNOSIS — Z90710 Acquired absence of both cervix and uterus: Secondary | ICD-10-CM

## 2022-04-20 DIAGNOSIS — Z1152 Encounter for screening for COVID-19: Secondary | ICD-10-CM | POA: Diagnosis not present

## 2022-04-20 DIAGNOSIS — S52202A Unspecified fracture of shaft of left ulna, initial encounter for closed fracture: Secondary | ICD-10-CM | POA: Diagnosis present

## 2022-04-20 DIAGNOSIS — S52592A Other fractures of lower end of left radius, initial encounter for closed fracture: Secondary | ICD-10-CM | POA: Diagnosis not present

## 2022-04-20 DIAGNOSIS — E785 Hyperlipidemia, unspecified: Secondary | ICD-10-CM | POA: Diagnosis present

## 2022-04-20 DIAGNOSIS — S42352A Displaced comminuted fracture of shaft of humerus, left arm, initial encounter for closed fracture: Secondary | ICD-10-CM | POA: Diagnosis not present

## 2022-04-20 DIAGNOSIS — F419 Anxiety disorder, unspecified: Secondary | ICD-10-CM | POA: Diagnosis present

## 2022-04-20 DIAGNOSIS — S5292XA Unspecified fracture of left forearm, initial encounter for closed fracture: Secondary | ICD-10-CM | POA: Diagnosis present

## 2022-04-20 DIAGNOSIS — T68XXXA Hypothermia, initial encounter: Secondary | ICD-10-CM | POA: Diagnosis not present

## 2022-04-20 DIAGNOSIS — R404 Transient alteration of awareness: Secondary | ICD-10-CM | POA: Diagnosis not present

## 2022-04-20 DIAGNOSIS — M7989 Other specified soft tissue disorders: Secondary | ICD-10-CM | POA: Diagnosis not present

## 2022-04-20 DIAGNOSIS — I6529 Occlusion and stenosis of unspecified carotid artery: Secondary | ICD-10-CM | POA: Diagnosis present

## 2022-04-20 DIAGNOSIS — Z888 Allergy status to other drugs, medicaments and biological substances status: Secondary | ICD-10-CM | POA: Diagnosis not present

## 2022-04-20 DIAGNOSIS — R221 Localized swelling, mass and lump, neck: Secondary | ICD-10-CM | POA: Diagnosis not present

## 2022-04-20 DIAGNOSIS — Z8249 Family history of ischemic heart disease and other diseases of the circulatory system: Secondary | ICD-10-CM | POA: Diagnosis not present

## 2022-04-20 DIAGNOSIS — N179 Acute kidney failure, unspecified: Secondary | ICD-10-CM | POA: Diagnosis present

## 2022-04-20 DIAGNOSIS — S42292A Other displaced fracture of upper end of left humerus, initial encounter for closed fracture: Secondary | ICD-10-CM | POA: Diagnosis not present

## 2022-04-20 DIAGNOSIS — R1310 Dysphagia, unspecified: Secondary | ICD-10-CM | POA: Diagnosis not present

## 2022-04-20 DIAGNOSIS — D494 Neoplasm of unspecified behavior of bladder: Secondary | ICD-10-CM | POA: Diagnosis present

## 2022-04-20 DIAGNOSIS — Z79899 Other long term (current) drug therapy: Secondary | ICD-10-CM

## 2022-04-20 DIAGNOSIS — S42212A Unspecified displaced fracture of surgical neck of left humerus, initial encounter for closed fracture: Secondary | ICD-10-CM | POA: Diagnosis not present

## 2022-04-20 DIAGNOSIS — R0689 Other abnormalities of breathing: Secondary | ICD-10-CM | POA: Diagnosis not present

## 2022-04-20 DIAGNOSIS — I6523 Occlusion and stenosis of bilateral carotid arteries: Secondary | ICD-10-CM | POA: Diagnosis not present

## 2022-04-20 LAB — CBC WITH DIFFERENTIAL/PLATELET
Abs Immature Granulocytes: 0.06 10*3/uL (ref 0.00–0.07)
Basophils Absolute: 0 10*3/uL (ref 0.0–0.1)
Basophils Relative: 0 %
Eosinophils Absolute: 0.1 10*3/uL (ref 0.0–0.5)
Eosinophils Relative: 1 %
HCT: 34.4 % — ABNORMAL LOW (ref 36.0–46.0)
Hemoglobin: 10.8 g/dL — ABNORMAL LOW (ref 12.0–15.0)
Immature Granulocytes: 1 %
Lymphocytes Relative: 15 %
Lymphs Abs: 1.4 10*3/uL (ref 0.7–4.0)
MCH: 29 pg (ref 26.0–34.0)
MCHC: 31.4 g/dL (ref 30.0–36.0)
MCV: 92.5 fL (ref 80.0–100.0)
Monocytes Absolute: 0.8 10*3/uL (ref 0.1–1.0)
Monocytes Relative: 9 %
Neutro Abs: 6.7 10*3/uL (ref 1.7–7.7)
Neutrophils Relative %: 74 %
Platelets: 178 10*3/uL (ref 150–400)
RBC: 3.72 MIL/uL — ABNORMAL LOW (ref 3.87–5.11)
RDW: 14.3 % (ref 11.5–15.5)
WBC: 9.1 10*3/uL (ref 4.0–10.5)
nRBC: 0 % (ref 0.0–0.2)

## 2022-04-20 LAB — COMPREHENSIVE METABOLIC PANEL
ALT: 11 U/L (ref 0–44)
AST: 20 U/L (ref 15–41)
Albumin: 3.5 g/dL (ref 3.5–5.0)
Alkaline Phosphatase: 53 U/L (ref 38–126)
Anion gap: 9 (ref 5–15)
BUN: 31 mg/dL — ABNORMAL HIGH (ref 8–23)
CO2: 20 mmol/L — ABNORMAL LOW (ref 22–32)
Calcium: 9.2 mg/dL (ref 8.9–10.3)
Chloride: 107 mmol/L (ref 98–111)
Creatinine, Ser: 1.31 mg/dL — ABNORMAL HIGH (ref 0.44–1.00)
GFR, Estimated: 36 mL/min — ABNORMAL LOW (ref >60)
Glucose, Bld: 122 mg/dL — ABNORMAL HIGH (ref 70–99)
Potassium: 4.6 mmol/L (ref 3.5–5.1)
Sodium: 136 mmol/L (ref 135–145)
Total Bilirubin: 0.3 mg/dL (ref 0.3–1.2)
Total Protein: 6.3 g/dL — ABNORMAL LOW (ref 6.5–8.1)

## 2022-04-20 LAB — URINALYSIS, ROUTINE W REFLEX MICROSCOPIC
Bilirubin Urine: NEGATIVE
Glucose, UA: NEGATIVE mg/dL
Hgb urine dipstick: NEGATIVE
Ketones, ur: NEGATIVE mg/dL
Leukocytes,Ua: NEGATIVE
Nitrite: NEGATIVE
Protein, ur: NEGATIVE mg/dL
Specific Gravity, Urine: 1.016 (ref 1.005–1.030)
pH: 5 (ref 5.0–8.0)

## 2022-04-20 LAB — PROTIME-INR
INR: 1.1 (ref 0.8–1.2)
Prothrombin Time: 13.8 seconds (ref 11.4–15.2)

## 2022-04-20 LAB — CK: Total CK: 127 U/L (ref 38–234)

## 2022-04-20 MED ORDER — ACETAMINOPHEN 650 MG RE SUPP: 650.0000 mg | Freq: Four times a day (QID) | RECTAL | Status: DC | PRN

## 2022-04-20 MED ORDER — HYDROMORPHONE HCL 1 MG/ML IJ SOLN
0.5000 mg | Freq: Once | INTRAMUSCULAR | Status: AC
  Administered 2022-04-20: 0.5 mg via INTRAVENOUS
  Filled 2022-04-20: qty 1

## 2022-04-20 MED ORDER — POLYVINYL ALCOHOL 1.4 % OP SOLN
1.0000 [drp] | Freq: Every day | OPHTHALMIC | Status: DC
  Administered 2022-04-20 – 2022-04-22 (×3): 1 [drp] via OPHTHALMIC
  Filled 2022-04-20: qty 15

## 2022-04-20 MED ORDER — ACETAMINOPHEN 500 MG PO TABS
1000.0000 mg | ORAL_TABLET | Freq: Two times a day (BID) | ORAL | Status: DC
  Administered 2022-04-20 – 2022-04-22 (×4): 1000 mg via ORAL
  Filled 2022-04-20 (×4): qty 2

## 2022-04-20 MED ORDER — HYDRALAZINE HCL 20 MG/ML IJ SOLN: 2.0000 mg | INTRAMUSCULAR | Status: DC | PRN

## 2022-04-20 MED ORDER — CALCIUM CARBONATE ANTACID 500 MG PO CHEW: 1.0000 | CHEWABLE_TABLET | Freq: Four times a day (QID) | ORAL | Status: DC | PRN

## 2022-04-20 MED ORDER — ALBUTEROL SULFATE (2.5 MG/3ML) 0.083% IN NEBU: 2.5000 mg | INHALATION_SOLUTION | Freq: Four times a day (QID) | RESPIRATORY_TRACT | Status: DC | PRN

## 2022-04-20 MED ORDER — SODIUM CHLORIDE 0.9% FLUSH
3.0000 mL | Freq: Two times a day (BID) | INTRAVENOUS | Status: DC
  Administered 2022-04-20 – 2022-04-22 (×5): 3 mL via INTRAVENOUS

## 2022-04-20 MED ORDER — FENTANYL CITRATE PF 50 MCG/ML IJ SOSY
50.0000 ug | PREFILLED_SYRINGE | Freq: Once | INTRAMUSCULAR | Status: AC
  Administered 2022-04-20: 50 ug via INTRAVENOUS
  Filled 2022-04-20: qty 1

## 2022-04-20 MED ORDER — FENTANYL CITRATE PF 50 MCG/ML IJ SOSY
50.0000 ug | PREFILLED_SYRINGE | Freq: Once | INTRAMUSCULAR | Status: AC
  Administered 2022-04-20: 50 ug via INTRAVENOUS

## 2022-04-20 MED ORDER — SODIUM CHLORIDE 0.9 % IV SOLN: INTRAVENOUS | Status: AC

## 2022-04-20 MED ORDER — FENTANYL CITRATE PF 50 MCG/ML IJ SOSY
PREFILLED_SYRINGE | INTRAMUSCULAR | Status: AC
  Filled 2022-04-20: qty 1

## 2022-04-20 MED ORDER — SENNOSIDES-DOCUSATE SODIUM 8.6-50 MG PO TABS
1.0000 | ORAL_TABLET | Freq: Every day | ORAL | Status: DC
  Administered 2022-04-20: 1 via ORAL
  Filled 2022-04-20: qty 1

## 2022-04-20 MED ORDER — ACETAMINOPHEN 325 MG PO TABS
650.0000 mg | ORAL_TABLET | Freq: Four times a day (QID) | ORAL | Status: DC | PRN
  Administered 2022-04-20 – 2022-04-21 (×2): 650 mg via ORAL
  Filled 2022-04-20 (×2): qty 2

## 2022-04-20 MED ORDER — ZINC OXIDE 20 % EX OINT
1.0000 | TOPICAL_OINTMENT | Freq: Three times a day (TID) | CUTANEOUS | Status: DC
  Administered 2022-04-20 – 2022-04-22 (×5): 1 via TOPICAL
  Filled 2022-04-20: qty 28.35

## 2022-04-20 MED ORDER — ENOXAPARIN SODIUM 30 MG/0.3ML IJ SOSY
30.0000 mg | PREFILLED_SYRINGE | INTRAMUSCULAR | Status: DC
  Administered 2022-04-20 – 2022-04-22 (×3): 30 mg via SUBCUTANEOUS
  Filled 2022-04-20 (×3): qty 0.3

## 2022-04-20 MED ORDER — HYDROCODONE-ACETAMINOPHEN 5-325 MG PO TABS
1.0000 | ORAL_TABLET | ORAL | Status: DC | PRN
  Administered 2022-04-20: 1 via ORAL
  Administered 2022-04-20 – 2022-04-21 (×3): 2 via ORAL
  Administered 2022-04-21: 1 via ORAL
  Filled 2022-04-20: qty 2
  Filled 2022-04-20: qty 1
  Filled 2022-04-20 (×2): qty 2
  Filled 2022-04-20 (×2): qty 1

## 2022-04-20 MED ORDER — FENTANYL CITRATE PF 50 MCG/ML IJ SOSY
25.0000 ug | PREFILLED_SYRINGE | INTRAMUSCULAR | Status: DC | PRN
  Administered 2022-04-20 (×2): 25 ug via INTRAVENOUS
  Filled 2022-04-20 (×2): qty 1

## 2022-04-20 NOTE — ED Notes (Signed)
Pt's left shoulder is ecchymotic and has 3+ swelling. Pt's left forearm is ecchymotic down to wrist. Pt has 3+ swelling of left wrist. Pt has 2+ left radial pulse, cap refill less than 3 sec, 1/5 left hand grip.

## 2022-04-20 NOTE — ED Triage Notes (Signed)
Pt to ED via [facility] from [facility]. Pt was walking with walker today and foot became stuck on carpet. Pt fell onto left side, pt has deformity and bruising to left shoulder and left wrist.  Pt alert and disoriented. Pt has a hx of dementia, pt is at baseline. Pt denies hitting head and/or LOC. Pt does not take blood thinners. Pt received tylenol '480mg'$  PO by EMS.

## 2022-04-20 NOTE — Progress Notes (Signed)
Orthopedic Tech Progress Note Patient Details:  Karen [name] [birth year] [id number]  Sling is in place on LUE. I saw the sugar tong order was entered after I had left so I will be back to get that on soon.  Ortho Devices Type of Ortho Device: Arm sling Ortho Device/Splint Location: LUE Ortho Device/Splint Interventions: Ordered, Application, Adjustment   Post Interventions Patient Tolerated: Well Instructions Provided: Care of device  Karen [name] Jeri Modena 04/20/2022, 2:27 PM

## 2022-04-20 NOTE — ED Notes (Signed)
Pre medication given prior to splint placement

## 2022-04-20 NOTE — Progress Notes (Signed)
[facility]opaedic [facility]uma [facility]   Imaging reviewed of left shoulder which demonstrates comminuted left proximal humerus fracture Excellent alignment recommend nonoperative treatment Sling for comfort  Full consult to follow  Jari Pigg, PA-C [phone] (C) 04/20/2022, 9:17 AM  [facility] [place] [address] [phone] Domingo Sep (F)      Patient ID: Karen [name], female   DOB: Apr 15, 1918, 88 y.o.   MRN: [id number]

## 2022-04-20 NOTE — ED Notes (Addendum)
I fed pt dinner, because she is unable to feed herself with her broken arm. PT didn't eat much of her dinner

## 2022-04-20 NOTE — ED Provider Notes (Signed)
[facility] EMERGENCY DEPARTMENT Provider Note   CSN: [id number] Arrival date & time: 04/20/22  0346     History  Chief Complaint  Patient presents with   Karen [name]    Karen [name] is a 88 y.o. female.  88 yo F who uses a walker and had a mechanical fall tonight with left upper and lower arm pain with associated hematomas in the same area. Severe dementia and not able to answer any other questions.    Fall       Home Medications Prior to Admission medications   Medication Sig Start Date End Date Taking? Authorizing Provider  acetaminophen (TYLENOL) 500 MG tablet Take 1,000 mg by mouth in the morning and at bedtime.   Yes [provider]  calcium carbonate (TUMS - DOSED IN MG ELEMENTAL CALCIUM) 500 MG chewable tablet Chew 1 tablet by mouth 4 (four) times daily as needed for heartburn.   Yes [provider]  Cholecalciferol (VITAMIN D3) 50 MCG (2000 UT) TABS Take 50 mcg by mouth daily.   Yes [provider]  nystatin (MYCOSTATIN/NYSTOP) powder Apply 1 Application topically 3 (three) times daily as needed.   Yes [provider]  polyvinyl alcohol (LIQUIFILM TEARS) 1.4 % ophthalmic solution Place 1 drop into both eyes daily.   Yes [provider]  Propylene Glycol (SYSTANE BALANCE) 0.6 % SOLN Place 1 drop into both eyes daily as needed (dry eyes).   Yes [provider]  zinc oxide 20 % ointment Apply 1 Application topically in the morning, at noon, and at bedtime.   Yes [provider]  amLODipine (NORVASC) 5 MG tablet Take 2 tablets (10 mg total) by mouth daily. Patient not taking: Reported on 04/20/2022 01/03/21   [place] Eric J, DO  aspirin EC 81 MG tablet Take 81 mg by mouth daily. Patient not taking: Reported on 04/20/2022    [provider]  fish oil-omega-3 fatty acids 1000 MG capsule Take 2 g by mouth 2 (two) times daily before a meal.  Patient not taking: Reported on 04/20/2022     [provider]  pravastatin (PRAVACHOL) 40 MG tablet TAKE 1 TABLET BY MOUTH DAILY Patient not taking: Reported on 04/20/2022 11/02/13   Jettie Booze, MD  tamsulosin (FLOMAX) 0.4 MG CAPS capsule Take 1 capsule (0.4 mg total) by mouth daily. Patient not taking: Reported on 04/20/2022 01/04/21   [place] Eric J, DO      Allergies    Lisinopril and Celebrex [celecoxib]    Review of Systems   Review of Systems  Physical Exam Updated Vital Signs BP (!) 145/55   Pulse 68   Temp 97.7 F (36.5 C) (Oral)   Resp 16   Ht '5\' 4"'$  (1.626 m)   Wt 61.2 kg   SpO2 94%   BMI 23.17 kg/m  Physical Exam Vitals and nursing note reviewed.  Constitutional:      Appearance: She is well-developed.  HENT:     Head: Normocephalic and atraumatic.     Mouth/Throat:     Mouth: Mucous membranes are moist.     Pharynx: Oropharynx is clear.  Eyes:     Pupils: Pupils are equal, round, and reactive to light.  Cardiovascular:     Rate and Rhythm: Normal rate and regular rhythm.  Pulmonary:     Effort: No respiratory distress.     Breath sounds: No stridor.  Abdominal:     General: There is no distension.  Musculoskeletal:  General: Tenderness (left proximal humerus and wrist. no TTP to BLE or RUE. no TTP to c/t/l spine. no obvious head trauma and no report (from witnessed fall) of hitting head) present. Normal range of motion.     Cervical back: Normal range of motion.  Neurological:     Mental Status: She is alert.     ED Results / Procedures / Treatments   Labs (all labs ordered are listed, but only abnormal results are displayed) Labs Reviewed  CBC WITH DIFFERENTIAL/PLATELET - Abnormal; Notable for the following components:      Result Value   RBC 3.72 (*)    Hemoglobin 10.8 (*)    HCT 34.4 (*)    All other components within normal limits  COMPREHENSIVE METABOLIC PANEL - Abnormal; Notable for the following components:   CO2 20 (*)    Glucose, Bld 122 (*)    BUN 31 (*)     Creatinine, Ser 1.31 (*)    Total Protein 6.3 (*)    GFR, Estimated 36 (*)    All other components within normal limits  PROTIME-INR    EKG None  Radiology DG Forearm Left  Result Date: 04/20/2022 CLINICAL DATA:  88 year old female status post fall while walking. EXAM: LEFT FOREARM - 1 VIEW COMPARISON:  Left wrist series today. FINDINGS: Only one view could be obtained for this series. Comminuted and dorsally impacted distal radius and ulna fractures are described on the wrist series separately. Bone mineralization is within normal limits for age. Left ulna and radius midshaft appear intact. Grossly intact proximal radius. Comminuted proximal left ulna fracture through the both the olecranon and trochlea with mild displacement. No elbow joint dislocation suspected. Regional soft tissue swelling. IMPRESSION: 1. Single forearm view demonstrating comminuted Proximal Ulna fracture through both the olecranon and trochlea with mild displacement. No elbow dislocation. 2. Distal radius and ulna fractures are described on the Wrist Series separately. No other radius fracture identified. Electronically Signed   By: Genevie Ann M.D.   On: 04/20/2022 05:44   DG Wrist Complete Left  Result Date: 04/20/2022 CLINICAL DATA:  88 year old female status post fall while walking. EXAM: LEFT WRIST - COMPLETE 3+ VIEW COMPARISON:  None Available. FINDINGS: Bone mineralization is within normal limits for age. Highly comminuted and impacted distal left radius and ulna fractures with DRU and radiocarpal joint involvement. Dorsal displacement of between 1/2 and 1 full shaft width at each fracture site. Regional soft tissue swelling. Impaction of up to 10 mm. Carpal bone alignment appears maintained. Metacarpals appear intact. IMPRESSION: Highly comminuted distal left radius and ulna fractures with impaction, between 1/2 and 1 full shaft width dorsal displacement, DRU and radiocarpal joint involvement. Electronically Signed    By: Genevie Ann M.D.   On: 04/20/2022 05:41   DG Humerus Left  Result Date: 04/20/2022 CLINICAL DATA:  88 year old female status post fall while walking. EXAM: LEFT HUMERUS - 2+ VIEW COMPARISON:  Left shoulder series today. FINDINGS: Proximal left humerus fracture as described on the shoulder series and no new features identified. Distal to that fracture of the left humerus appears intact. Bone mineralization is within normal limits for age. Alignment at the elbow appears grossly maintained. Visible left scapula, clavicle, and ribs appear intact. IMPRESSION: 1. Proximal left humerus fracture as described on the Shoulder Series. 2. No other fracture identified about the left humerus. Electronically Signed   By: Genevie Ann M.D.   On: 04/20/2022 05:39   DG Shoulder Left  Result Date: 04/20/2022  CLINICAL DATA:  88 year old female status post fall while walking. EXAM: LEFT SHOULDER - 2+ VIEW COMPARISON:  Chest radiographs 12/31/2020 and earlier. FINDINGS: Bone mineralization is within normal limits for age. Comminuted fracture of the proximal left humerus through the humeral neck with nearly 1 full shaft width anterior displacement and approximately 17 mm of overriding. Humeral head articular surface appears to remain located within the glenoid. No superimposed left clavicle or scapula fracture identified. Visible left ribs and chest appear stable and intact. IMPRESSION: Comminuted fracture of the proximal left humerus neck with nearly 1 full shaft width anterior displacement and 17 mm of overriding. Electronically Signed   By: Genevie Ann M.D.   On: 04/20/2022 05:38    Procedures Procedures    Medications Ordered in ED Medications  fentaNYL (SUBLIMAZE) injection 50 mcg (50 mcg Intravenous Given 04/20/22 0410)  fentaNYL (SUBLIMAZE) injection 50 mcg (50 mcg Intravenous Given 04/20/22 0508)  HYDROmorphone (DILAUDID) injection 0.5 mg (0.5 mg Intravenous Given 04/20/22 3202)    ED Course/ Medical Decision  Making/ A&P                           Medical Decision Making Amount and/or Complexity of Data Reviewed Labs: ordered. Radiology: ordered.  Risk Prescription drug management. Decision regarding hospitalization.   I discussed with Dr. Marcelino Scot regarding her shoulder he will see her later for surgical opinion.  Discussed with Dr. Tempie Donning about her wrist and he felt like this was unlikely to be an operation secondary to her age but would see her later for ultimate surgical decision.  I discussed with her daughter-in-law who was concerned about her going back to assisted living with a broken arm which I understood especially using a walker that she has basically been in a wheelchair at this point and likely will come out of it if she is got to be in until her arm heals.  Will admit to medicine for physical therapy and an upgrade to a skilled nursing for rehab after discharge pending surgeon evaluation.   Final Clinical Impression(s) / ED Diagnoses Final diagnoses:  Closed displaced fracture of surgical neck of left humerus, unspecified fracture morphology, initial encounter  Closed fracture of distal ends of left radius and ulna, initial encounter    Rx / DC Orders ED Discharge Orders     None         Lakethia Coppess, Corene Cornea, MD 04/20/22 [phone]

## 2022-04-20 NOTE — H&P (Signed)
History and Physical    Patient: Karen [name]:[id number] DOB: [birth year] DOA: 04/20/2022 DOS: the patient was seen and examined on 04/20/2022 PCP: Leeroy Cha, MD  Patient coming from: [facility] via EMS  Chief Complaint:  Chief Complaint  Patient presents with   Fall   HPI: Karen [name] is a 88 y.o.  right handed female with medical history significant of hypertension, hyperlipidemia, carotid artery stenosis, anxiety, depression, history of endometrial cancer, bladder tumor, and GERD presents after having a fall.  History is somewhat limited due to patient being hard of hearing despite having hearing aids in place and memory issues.  She reports that she thinks that she fell out of the bathroom.  She is not sure if she may have been reaching for her walker and missed which caused her fall.  She does complain of pain in her left arm and shoulder with some numbness.  Her daughter-in-law states that the patient's memory is not that good at baseline.  Patient often repeats herself and cannot remember things from time to time.  In the emergency department patient was noted to have some mild tachypnea and blood pressures elevated up to 182/65.  Labs significant for BUN 31, creatinine 1.31.  X-rays revealed communicated fracture of the proximal left humerus neck, comminuted proximal ulna fracture through both the olecranon and trochlea with mild displacement, and highly comminuted distal left radius and ulna fractures with impaction.  Case had been discussed with Dr. Marcelino Scot and Tempie Donning will evaluate the patient later on this morning.  Review of Systems: unable to review all systems due to the inability of the patient to answer questions and she states she cannot remember. Past Medical History:  Diagnosis Date   Anxiety    Arthritis knee   Bladder tumor    Depression    Dyspnea on exertion    GERD (gastroesophageal reflux disease)    Hearing loss BILATERAL  AIDS   Hematuria    History of endometrial cancer S/P HYSTERECTOMY  2001   Hyperlipidemia    Hypertension CARDIOLOGIST-- DR Irish Lack-  LOV  MARCH 2013--  REQUESTED NOTE AND EKG   Occlusion and stenosis of carotid artery without mention of cerebral infarction FOLLOWED BY DR Kellie Simmering--  LEFT ICA  0=19%  AND RIGHT ICA  20-395   OCCASIONAL DIZZINESS   PONV (postoperative nausea and vomiting)    Past Surgical History:  Procedure Laterality Date   CAROTID ENDARTERECTOMY  01-19-2010  DR LAWSON   Left CEA with Dacron patch   CATARACT EXTRACTION W/ INTRAOCULAR LENS  IMPLANT, BILATERAL     TRANSTHORACIC ECHOCARDIOGRAM  11-24-2009  DR VARANASI   LVEF  65-70%/  MILD MITRAL, ATRIAL AND TRICUSID REGURG.   TRANSURETHRAL RESECTION OF BLADDER TUMOR  03/16/2012   Procedure: TRANSURETHRAL RESECTION OF BLADDER TUMOR (TURBT);  Surgeon: Claybon Jabs, MD;  Location: [facility];  Service: Urology;  Laterality: N/A;  1 hour requested for this case  CAMERA GYRUS   VAGINAL HYSTERECTOMY  2001   W/  BILATERAL SALPINGO-OOPHECTOMY FOR ENDOMETRIAL CANCER   Social History:  reports that she has never smoked. She has never used smokeless tobacco. She reports that she does not drink alcohol and does not use drugs.  Allergies  Allergen Reactions   Lisinopril Swelling   Celebrex [Celecoxib] Hives and Itching    Family History  Problem Relation Age of Onset   Coronary artery disease Father    Hypertension Father    Hypertension Mother  Cancer Mother        Colon    Prior to Admission medications   Medication Sig Start Date End Date Taking? Authorizing Provider  acetaminophen (TYLENOL) 500 MG tablet Take 1,000 mg by mouth in the morning and at bedtime.   Yes [provider]  calcium carbonate (TUMS - DOSED IN MG ELEMENTAL CALCIUM) 500 MG chewable tablet Chew 1 tablet by mouth 4 (four) times daily as needed for heartburn.   Yes [provider]  Cholecalciferol (VITAMIN D3) 50  MCG (2000 UT) TABS Take 50 mcg by mouth daily.   Yes [provider]  nystatin (MYCOSTATIN/NYSTOP) powder Apply 1 Application topically 3 (three) times daily as needed.   Yes [provider]  polyvinyl alcohol (LIQUIFILM TEARS) 1.4 % ophthalmic solution Place 1 drop into both eyes daily.   Yes [provider]  Propylene Glycol (SYSTANE BALANCE) 0.6 % SOLN Place 1 drop into both eyes daily as needed (dry eyes).   Yes [provider]  zinc oxide 20 % ointment Apply 1 Application topically in the morning, at noon, and at bedtime.   Yes [provider]  amLODipine (NORVASC) 5 MG tablet Take 2 tablets (10 mg total) by mouth daily. Patient not taking: Reported on 04/20/2022 01/03/21   [place] Eric J, DO  aspirin EC 81 MG tablet Take 81 mg by mouth daily. Patient not taking: Reported on 04/20/2022    [provider]  fish oil-omega-3 fatty acids 1000 MG capsule Take 2 g by mouth 2 (two) times daily before a meal.  Patient not taking: Reported on 04/20/2022    [provider]  pravastatin (PRAVACHOL) 40 MG tablet TAKE 1 TABLET BY MOUTH DAILY Patient not taking: Reported on 04/20/2022 11/02/13   Jettie Booze, MD  tamsulosin (FLOMAX) 0.4 MG CAPS capsule Take 1 capsule (0.4 mg total) by mouth daily. Patient not taking: Reported on 04/20/2022 01/04/21   [place] Eric J, [place]    Physical Exam: Vitals:   04/20/22 0630 04/20/22 0645 04/20/22 0700 04/20/22 0759  BP: (!) 139/54 (!) 150/60 (!) 145/55 (!) 162/61  Pulse: 68 71 68 79  Resp: 13 (!) '22 16 20  '$ Temp:    97.8 F (36.6 C)  TempSrc:    Oral  SpO2: 94% 94% 94% 99%  Weight:      Height:        Constitutional: Frail elderly female currently in some discomfort Eyes: PERRL, lids and conjunctivae normal ENMT: Mucous membranes are moist.  Hearing aids in right ear. Neck: normal, supple,   Respiratory: Clear to auscultation bilaterally with no significant wheezes or rhonchi  appreciated. Cardiovascular: Regular rate and rhythm, no murmurs / rubs / gallops. No extremity edema.  Abdomen: no tenderness, no masses palpated.  Bowel sounds positive.  Musculoskeletal: Deformity of the left shoulder, forearm, and wrist Skin: At the most of these noted of the left shoulder and forearm. Neurologic: CN 2-12 grossly intact.  Reports decreased sensation on the left arm.  Able to all extremities Psychiatric: Alert and oriented to person and place.. Normal mood.   Data Reviewed:  Reviewed labs, imaging, and pertinent records as noted above in HPI.  Assessment and Plan: Left proximal humerus, proximal/distal ulna, and distal radius fracture secondary to fall Acute.  Patient had been ambulating with her walker when she had a mechanical fall.  X-rays revealed communicated fractures of the proximal left humerus neck, proximal ulnar and distal left radius.  Case was discussed with Dr.  Benfield of orthopedic and Dr. Marcelino Scot of hand surgery who will evaluate the patient.  Patient left arm was placed in a sling -Admit to a MedSurg bed -Neurovascular checks -Continue sling with nonweightbearing on left upper extremity -Hydrocodone/fentanyl IV as needed for pain -PT/OT to eval and treat -TOC as patient likely needs to be transition to skilled -Appreciate orthopedic and hand surgery consultative services,  will follow-up for any further recommendations  Acute kidney injury Acute.On admission creatinine 1.31 with BUN 31.  Baseline creatinine previously was noted to be 0.8 back in July of last year. -Check CK and urinalysis -Normal saline IV fluids at 75 mL/h x 1 L -Recheck kidney function in a.m.  Essential hypertension Blood pressures elevated up to 182/65.  Patient previously had been on amlodipine 10 mg daily, but no longer taking this medication after it was discontinued by her provider. -Hydralazine 2 mg IV as needed for elevated blood pressures  Normocytic anemia Acute.   Hemoglobin 10.8 g/dL which appears lower than priors, but vital signs otherwise stable at this time. -Recheck CBC tomorrow morning  Dementia without behavioral disturbance Patient's daughter-in-law notes that her memory is poor and she often repeats herself.  Patient is currently not on anything for treatment. -Delirium precautions  DVT prophylaxis: Lovenox Advance Care Planning:   Code Status: DNR. confirmed by the patient's daughter-in-law over the phone.  Consults: Orthopedics and hand surgery  Family Communication: Discussed with the patient's daughter-in-law who also the patient's healthcare power of attorney over the phone  Severity of Illness: The appropriate patient status for this patient is INPATIENT. Inpatient status is judged to be reasonable and necessary in order to provide the required intensity of service to ensure the patient's safety. The patient's presenting symptoms, physical exam findings, and initial radiographic and laboratory data in the context of their chronic comorbidities is felt to place them at high risk for further clinical deterioration. Furthermore, it is not anticipated that the patient will be medically stable for discharge from the hospital within 2 midnights of admission.   * I certify that at the point of admission it is my clinical judgment that the patient will require inpatient hospital care spanning beyond 2 midnights from the point of admission due to high intensity of service, high risk for further deterioration and high frequency of surveillance required.*  Author: Norval Morton, MD 04/20/2022 8:09 AM  For on call review [email or web address].

## 2022-04-20 NOTE — Consult Note (Signed)
Orthopaedic Trauma Service (OTS) Consult   Patient ID: Karen [name] MRN: [id number] DOB/AGE: 10/04/1917 88 y.o.   Reason for Consult: Left proximal humerus fracture Referring Physician: Merrily Pew, MD (EDP)   HPI: Karen [name] is an 88 y.o. female who sustained a fall very early this morning.  Sustained injury to her left shoulder as well as her left wrist.  Uses a walker at baseline.  Patient is able to provide some information but not a whole lot regarding her fall, she is hard of hearing and does have some baseline memory issues.  Daughter-in-law not present but communicated via telephone.  Patient seen and evaluated in the emergency department.  She does appear comfortable.  She is in a makeshift sling for left upper extremity.  Left wrist is not splinted at this time.  Past Medical History:  Diagnosis Date   Anxiety    Arthritis knee   Bladder tumor    Depression    Dyspnea on exertion    GERD (gastroesophageal reflux disease)    Hearing loss BILATERAL AIDS   Hematuria    History of endometrial cancer S/P HYSTERECTOMY  2001   Hyperlipidemia    Hypertension CARDIOLOGIST-- DR Irish Lack-  LOV  MARCH 2013--  REQUESTED NOTE AND EKG   Occlusion and stenosis of carotid artery without mention of cerebral infarction FOLLOWED BY DR Kellie Simmering--  LEFT ICA  0=19%  AND RIGHT ICA  20-395   OCCASIONAL DIZZINESS   PONV (postoperative nausea and vomiting)     Past Surgical History:  Procedure Laterality Date   CAROTID ENDARTERECTOMY  01-19-2010  DR LAWSON   Left CEA with Dacron patch   CATARACT EXTRACTION W/ INTRAOCULAR LENS  IMPLANT, BILATERAL     TRANSTHORACIC ECHOCARDIOGRAM  11-24-2009  DR VARANASI   LVEF  65-70%/  MILD MITRAL, ATRIAL AND TRICUSID REGURG.   TRANSURETHRAL RESECTION OF BLADDER TUMOR  03/16/2012   Procedure: TRANSURETHRAL RESECTION OF BLADDER TUMOR (TURBT);  Surgeon: Claybon Jabs, MD;  Location: [facility];  Service: Urology;  Laterality:  N/A;  1 hour requested for this case  CAMERA GYRUS   VAGINAL HYSTERECTOMY  2001   W/  BILATERAL SALPINGO-OOPHECTOMY FOR ENDOMETRIAL CANCER    Family History  Problem Relation Age of Onset   Coronary artery disease Father    Hypertension Father    Hypertension Mother    Cancer Mother        Colon    Social History:  reports that she has never smoked. She has never used smokeless tobacco. She reports that she does not drink alcohol and does not use drugs.  Allergies:  Allergies  Allergen Reactions   Lisinopril Swelling   Celebrex [Celecoxib] Hives and Itching    Medications: I have reviewed the patient's current medications. Current Meds  Medication Sig   acetaminophen (TYLENOL) 500 MG tablet Take 1,000 mg by mouth in the morning and at bedtime.   calcium carbonate (TUMS - DOSED IN MG ELEMENTAL CALCIUM) 500 MG chewable tablet Chew 1 tablet by mouth 4 (four) times daily as needed for heartburn.   Cholecalciferol (VITAMIN D3) 50 MCG (2000 UT) TABS Take 50 mcg by mouth daily.   nystatin (MYCOSTATIN/NYSTOP) powder Apply 1 Application topically 3 (three) times daily as needed.   polyvinyl alcohol (LIQUIFILM TEARS) 1.4 % ophthalmic solution Place 1 drop into both eyes daily.   Propylene Glycol (SYSTANE BALANCE) 0.6 % SOLN Place 1 drop into both eyes daily  as needed (dry eyes).   zinc oxide 20 % ointment Apply 1 Application topically in the morning, at noon, and at bedtime.     Results for orders placed or performed during the hospital encounter of 04/20/22 (from the past 48 hour(s))  CBC with Differential     Status: Abnormal   Collection Time: 04/20/22  4:00 AM  Result Value Ref Range   WBC 9.1 4.0 - 10.5 K/uL   RBC 3.72 (L) 3.87 - 5.11 MIL/uL   Hemoglobin 10.8 (L) 12.0 - 15.0 g/dL   HCT 34.4 (L) 36.0 - 46.0 %   MCV 92.5 80.0 - 100.0 fL   MCH 29.0 26.0 - 34.0 pg   MCHC 31.4 30.0 - 36.0 g/dL   RDW 14.3 11.5 - 15.5 %   Platelets 178 150 - 400 K/uL   nRBC 0.0 0.0 - 0.2 %    Neutrophils Relative % 74 %   Neutro Abs 6.7 1.7 - 7.7 K/uL   Lymphocytes Relative 15 %   Lymphs Abs 1.4 0.7 - 4.0 K/uL   Monocytes Relative 9 %   Monocytes Absolute 0.8 0.1 - 1.0 K/uL   Eosinophils Relative 1 %   Eosinophils Absolute 0.1 0.0 - 0.5 K/uL   Basophils Relative 0 %   Basophils Absolute 0.0 0.0 - 0.1 K/uL   Immature Granulocytes 1 %   Abs Immature Granulocytes 0.06 0.00 - 0.07 K/uL    Comment: Performed at [facility], [address], [place], [place] [address]  Comprehensive metabolic panel     Status: Abnormal   Collection Time: 04/20/22  4:00 AM  Result Value Ref Range   Sodium 136 135 - 145 mmol/L   Potassium 4.6 3.5 - 5.1 mmol/L   Chloride 107 98 - 111 mmol/L   CO2 20 (L) 22 - 32 mmol/L   Glucose, Bld 122 (H) 70 - 99 mg/dL    Comment: Glucose reference range applies only to samples taken after fasting for at least 8 hours.   BUN 31 (H) 8 - 23 mg/dL   Creatinine, Ser 1.31 (H) 0.44 - 1.00 mg/dL   Calcium 9.2 8.9 - 10.3 mg/dL   Total Protein 6.3 (L) 6.5 - 8.1 g/dL   Albumin 3.5 3.5 - 5.0 g/dL   AST 20 15 - 41 U/L   ALT 11 0 - 44 U/L   Alkaline Phosphatase 53 38 - 126 U/L   Total Bilirubin 0.3 0.3 - 1.2 mg/dL   GFR, Estimated 36 (L) >60 mL/min    Comment: (NOTE) Calculated using the CKD-EPI Creatinine Equation (2021)    Anion gap 9 5 - 15    Comment: Performed at [facility] [address] [place], [place] [address]  Protime-INR     Status: None   Collection Time: 04/20/22  4:00 AM  Result Value Ref Range   Prothrombin Time 13.8 11.4 - 15.2 seconds   INR 1.1 0.8 - 1.2    Comment: (NOTE) INR goal varies based on device and disease states. Performed at [facility], [place] [address] [place],  [address]     DG Forearm Left  Result Date: 04/20/2022 CLINICAL DATA:  88 year old female status post fall while walking. EXAM: LEFT FOREARM - 1 VIEW COMPARISON:  Left wrist series today. FINDINGS: Only one view could be obtained for  this series. Comminuted and dorsally impacted distal radius and ulna fractures are described on the wrist series separately. Bone mineralization is within normal limits  for age. Left ulna and radius midshaft appear intact. Grossly intact proximal radius. Comminuted proximal left ulna fracture through the both the olecranon and trochlea with mild displacement. No elbow joint dislocation suspected. Regional soft tissue swelling. IMPRESSION: 1. Single forearm view demonstrating comminuted Proximal Ulna fracture through both the olecranon and trochlea with mild displacement. No elbow dislocation. 2. Distal radius and ulna fractures are described on the Wrist Series separately. No other radius fracture identified. Electronically Signed   By: Genevie Ann M.D.   On: 04/20/2022 05:44   DG Wrist Complete Left  Result Date: 04/20/2022 CLINICAL DATA:  88 year old female status post fall while walking. EXAM: LEFT WRIST - COMPLETE 3+ VIEW COMPARISON:  None Available. FINDINGS: Bone mineralization is within normal limits for age. Highly comminuted and impacted distal left radius and ulna fractures with DRU and radiocarpal joint involvement. Dorsal displacement of between 1/2 and 1 full shaft width at each fracture site. Regional soft tissue swelling. Impaction of up to 10 mm. Carpal bone alignment appears maintained. Metacarpals appear intact. IMPRESSION: Highly comminuted distal left radius and ulna fractures with impaction, between 1/2 and 1 full shaft width dorsal displacement, DRU and radiocarpal joint involvement. Electronically Signed   By: Genevie Ann M.D.   On: 04/20/2022 05:41   DG Humerus Left  Result Date: 04/20/2022 CLINICAL DATA:  88 year old female status post fall while walking. EXAM: LEFT HUMERUS - 2+ VIEW COMPARISON:  Left shoulder series today. FINDINGS: Proximal left humerus fracture as described on the shoulder series and no new features identified. Distal to that fracture of the left humerus appears  intact. Bone mineralization is within normal limits for age. Alignment at the elbow appears grossly maintained. Visible left scapula, clavicle, and ribs appear intact. IMPRESSION: 1. Proximal left humerus fracture as described on the Shoulder Series. 2. No other fracture identified about the left humerus. Electronically Signed   By: Genevie Ann M.D.   On: 04/20/2022 05:39   DG Shoulder Left  Result Date: 04/20/2022 CLINICAL DATA:  88 year old female status post fall while walking. EXAM: LEFT SHOULDER - 2+ VIEW COMPARISON:  Chest radiographs 12/31/2020 and earlier. FINDINGS: Bone mineralization is within normal limits for age. Comminuted fracture of the proximal left humerus through the humeral neck with nearly 1 full shaft width anterior displacement and approximately 17 mm of overriding. Humeral head articular surface appears to remain located within the glenoid. No superimposed left clavicle or scapula fracture identified. Visible left ribs and chest appear stable and intact. IMPRESSION: Comminuted fracture of the proximal left humerus neck with nearly 1 full shaft width anterior displacement and 17 mm of overriding. Electronically Signed   By: Genevie Ann M.D.   On: 04/20/2022 05:38    Intake/Output    None      Review of Systems  Respiratory:  Negative for shortness of breath.   Cardiovascular:  Negative for chest pain and palpitations.  Gastrointestinal:  Negative for nausea.  Musculoskeletal:        L arm pain    Blood pressure (!) 161/76, pulse 69, temperature 97.7 F (36.5 C), temperature source Oral, resp. rate (!) 22, height '5\' 4"'$  (1.626 m), weight 61.2 kg, SpO2 97 %. Physical Exam Vitals and nursing note reviewed.  Constitutional:      General: She is awake.     Comments: Elderly white female, comfortable appearing  Cardiovascular:     Heart sounds: S1 normal and S2 normal.  Pulmonary:     Comments: Unlabored Musculoskeletal:  Comments: Left upper extremity Extensive ecchymosis  to the left shoulder and wrist Makeshift sling in place + Radial pulse Radial, ulnar, median nerve motor and sensory functions intact Axillary nerve sensory function intact Deformity left wrist Elbow is nontender No traumatic wounds noted   Skin:    General: Skin is warm.     Capillary Refill: Capillary refill takes less than 2 seconds.  Neurological:     Mental Status: She is alert.  Psychiatric:        Behavior: Behavior is cooperative.        Assessment/Plan:  88 year old female s/p fall with left proximal humerus fracture and left distal radius fracture  -fall  - L proximal humerus fracture   Very good alignment  Non-op  Sling  Ice prn swelling and pain   - L distal radius fracture   Non-op   Sugar tong then cast likely   Per Dr. Tempie Donning (hand surgery)  - Dispo:  Ortho issues as above  Continue per medicine     Jari Pigg, PA-C [phone] (C) 04/20/2022, 12:46 PM  [facility] [place] [address] [phone] Jenetta [phone] (F)    After 5pm and on the weekends please log on to [employer], go to orthopaedics and the look under the [facility] Call for the provider(s) on call. You can also call our office at [phone] and then follow the prompts to be connected to the call team.

## 2022-04-21 DIAGNOSIS — S42212A Unspecified displaced fracture of surgical neck of left humerus, initial encounter for closed fracture: Secondary | ICD-10-CM | POA: Diagnosis not present

## 2022-04-21 LAB — BASIC METABOLIC PANEL
Anion gap: 11 (ref 5–15)
BUN: 19 mg/dL (ref 8–23)
CO2: 19 mmol/L — ABNORMAL LOW (ref 22–32)
Calcium: 8.4 mg/dL — ABNORMAL LOW (ref 8.9–10.3)
Chloride: 102 mmol/L (ref 98–111)
Creatinine, Ser: 0.93 mg/dL (ref 0.44–1.00)
GFR, Estimated: 54 mL/min — ABNORMAL LOW (ref >60)
Glucose, Bld: 102 mg/dL — ABNORMAL HIGH (ref 70–99)
Potassium: 4.4 mmol/L (ref 3.5–5.1)
Sodium: 132 mmol/L — ABNORMAL LOW (ref 135–145)

## 2022-04-21 LAB — CBC
HCT: 31.1 % — ABNORMAL LOW (ref 36.0–46.0)
Hemoglobin: 9.5 g/dL — ABNORMAL LOW (ref 12.0–15.0)
MCH: 28.6 pg (ref 26.0–34.0)
MCHC: 30.5 g/dL (ref 30.0–36.0)
MCV: 93.7 fL (ref 80.0–100.0)
Platelets: 144 10*3/uL — ABNORMAL LOW (ref 150–400)
RBC: 3.32 MIL/uL — ABNORMAL LOW (ref 3.87–5.11)
RDW: 14.1 % (ref 11.5–15.5)
WBC: 7.3 10*3/uL (ref 4.0–10.5)
nRBC: 0 % (ref 0.0–0.2)

## 2022-04-21 MED ORDER — SENNOSIDES-DOCUSATE SODIUM 8.6-50 MG PO TABS
1.0000 | ORAL_TABLET | Freq: Two times a day (BID) | ORAL | Status: DC
  Administered 2022-04-21 – 2022-04-22 (×2): 1 via ORAL
  Filled 2022-04-21 (×2): qty 1

## 2022-04-21 MED ORDER — AMLODIPINE BESYLATE 2.5 MG PO TABS
2.5000 mg | ORAL_TABLET | Freq: Every day | ORAL | Status: DC
  Administered 2022-04-21: 2.5 mg via ORAL
  Filled 2022-04-21: qty 1

## 2022-04-21 MED ORDER — POLYETHYLENE GLYCOL 3350 17 G PO PACK
17.0000 g | PACK | Freq: Every day | ORAL | Status: DC
  Administered 2022-04-21 – 2022-04-22 (×2): 17 g via ORAL
  Filled 2022-04-21 (×2): qty 1

## 2022-04-21 NOTE — Progress Notes (Addendum)
PROGRESS NOTE    Karen [name]  XLK:[id number] DOB: 1917/10/10 DOA: 04/20/2022 PCP: Leeroy Cha, MD   Brief Narrative: 88 with past medical history significant for hypertension, hyperlipidemia, carotid artery stenosis, anxiety, depression, history of endometrial cancer, bladder tumor, GERD presents after having a fall.  Patient hard of hearing and history of memory issues.  Presented complaining of pain in her left arm shoulder with some numbness.  Evaluation in the ED patient was found to be tachypneic and blood pressure 182/65.  X-ray revealed communicated fracture of the proximal left humerus neck and community of proximal ulna fracture through both the olecranon and trochlea with mild displacement and highly  comminuted distal left radius and ulnar fractures with impaction.   Assessment & Plan:   Principal Problem:   Fall Active Problems:   Traumatic closed displaced fracture of upper end of humerus, left, initial encounter   Left ulnar fracture   Left radial fracture   AKI (acute kidney injury) ([place])   Essential hypertension   Normocytic anemia   Dementia without behavioral disturbance (HCC)   1-Left proximal humerus, proximal distal E ulna and distal radius fracture secondary to fall; X-ray: communicated fractures of the proximal left humerus neck, proximal ulnar and distal left radius.  Continue with pain management. Nonweightbearing of the left upper extremity Continue with hydrocodone as needed Ortho recommend sling for the left proximal humerus fracture, nonoperative.  Left distal radius fracture sugar-tong then cast likely.  PT consult   AKI: Creatinine on admission 1.3, BUN 31.  Creatinine previously 0.8 back in July. Continue with IV fluids  Essential hypertension: Amlodipine previously discontinued.  On as needed hydralazine Will start low dose Norvasc.   Normocytic anemia: Monitor hb.   Dementia without behavioral disturbance Per family patient  often repeats herself. Delirium precaution    Estimated body mass index is 23.17 kg/m as calculated from the following:   Height as of this encounter: '5\' 4"'$  (1.626 m).   Weight as of this encounter: 61.2 kg.   DVT prophylaxis: Lovenox Code Status: DNR Family Communication: daughter in law updated.  Disposition Plan:  Status is: Inpatient Remains inpatient appropriate because: Pain management    Consultants:  Ortho  Procedures:    Antimicrobials:    Subjective: She is alert, she wants  to be reposition better in the bed.  I helped her. Pain appears to be controlled  Objective: Vitals:   04/20/22 1839 04/20/22 2105 04/20/22 2242 04/21/22 0543  BP:  (!) 175/67 (!) 165/78 (!) 170/66  Pulse:  76 79 71  Resp:  20  20  Temp: 98.1 F (36.7 C) 97.7 F (36.5 C)  98.1 F (36.7 C)  TempSrc: Oral Oral  Oral  SpO2:  95%  97%  Weight:      Height:        Intake/Output Summary (Last 24 hours) at 04/21/2022 3664 Last data filed at 04/21/2022 0600 Gross per 24 hour  Intake 592.24 ml  Output 250 ml  Net 342.24 ml   Filed Weights   04/20/22 0353  Weight: 61.2 kg    Examination:  General exam: Appears calm and comfortable  Respiratory system: Clear to auscultation. Respiratory effort normal. Cardiovascular system: S1 & S2 heard, RRR. No JVD, murmurs, rubs, gallops or clicks. No pedal edema. Gastrointestinal system: Abdomen is nondistended, soft and nontender. No organomegaly or masses felt. Normal bowel sounds heard. Central nervous system: Alert  Extremities: Symmetric 5 x 5 power.  Left upper extremity on a sling,  sugar-tong  in place    Data Reviewed: I have personally reviewed following labs and imaging studies  CBC: Recent Labs  Lab 04/20/22 0400  WBC 9.1  NEUTROABS 6.7  HGB 10.8*  HCT 34.4*  MCV 92.5  PLT 433   Basic Metabolic Panel: Recent Labs  Lab 04/20/22 0400  NA 136  K 4.6  CL 107  CO2 20*  GLUCOSE 122*  BUN 31*  CREATININE 1.31*   CALCIUM 9.2   GFR: Estimated Creatinine Clearance: 18.7 mL/min (A) (by C-G formula based on SCr of 1.31 mg/dL (H)). Liver Function Tests: Recent Labs  Lab 04/20/22 0400  AST 20  ALT 11  ALKPHOS 53  BILITOT 0.3  PROT 6.3*  ALBUMIN 3.5   No results for input(s): "LIPASE", "AMYLASE" in the last 168 hours. No results for input(s): "AMMONIA" in the last 168 hours. Coagulation Profile: Recent Labs  Lab 04/20/22 0400  INR 1.1   Cardiac Enzymes: Recent Labs  Lab 04/20/22 2033  CKTOTAL 127   BNP (last 3 results) No results for input(s): "PROBNP" in the last 8760 hours. HbA1C: No results for input(s): "HGBA1C" in the last 72 hours. CBG: No results for input(s): "GLUCAP" in the last 168 hours. Lipid Profile: No results for input(s): "CHOL", "HDL", "LDLCALC", "TRIG", "CHOLHDL", "LDLDIRECT" in the last 72 hours. Thyroid Function Tests: No results for input(s): "TSH", "T4TOTAL", "FREET4", "T3FREE", "THYROIDAB" in the last 72 hours. Anemia Panel: No results for input(s): "VITAMINB12", "FOLATE", "FERRITIN", "TIBC", "IRON", "RETICCTPCT" in the last 72 hours. Sepsis Labs: No results for input(s): "PROCALCITON", "LATICACIDVEN" in the last 168 hours.  No results found for this or any previous visit (from the past 240 hour(s)).       Radiology Studies: DG Forearm Left  Result Date: 04/20/2022 CLINICAL DATA:  88 year old female status post fall while walking. EXAM: LEFT FOREARM - 1 VIEW COMPARISON:  Left wrist series today. FINDINGS: Only one view could be obtained for this series. Comminuted and dorsally impacted distal radius and ulna fractures are described on the wrist series separately. Bone mineralization is within normal limits for age. Left ulna and radius midshaft appear intact. Grossly intact proximal radius. Comminuted proximal left ulna fracture through the both the olecranon and trochlea with mild displacement. No elbow joint dislocation suspected. Regional soft tissue  swelling. IMPRESSION: 1. Single forearm view demonstrating comminuted Proximal Ulna fracture through both the olecranon and trochlea with mild displacement. No elbow dislocation. 2. Distal radius and ulna fractures are described on the Wrist Series separately. No other radius fracture identified. Electronically Signed   By: Genevie Ann M.D.   On: 04/20/2022 05:44   DG Wrist Complete Left  Result Date: 04/20/2022 CLINICAL DATA:  88 year old female status post fall while walking. EXAM: LEFT WRIST - COMPLETE 3+ VIEW COMPARISON:  None Available. FINDINGS: Bone mineralization is within normal limits for age. Highly comminuted and impacted distal left radius and ulna fractures with DRU and radiocarpal joint involvement. Dorsal displacement of between 1/2 and 1 full shaft width at each fracture site. Regional soft tissue swelling. Impaction of up to 10 mm. Carpal bone alignment appears maintained. Metacarpals appear intact. IMPRESSION: Highly comminuted distal left radius and ulna fractures with impaction, between 1/2 and 1 full shaft width dorsal displacement, DRU and radiocarpal joint involvement. Electronically Signed   By: Genevie Ann M.D.   On: 04/20/2022 05:41   DG Humerus Left  Result Date: 04/20/2022 CLINICAL DATA:  88 year old female status post fall while walking. EXAM: LEFT HUMERUS -  2+ VIEW COMPARISON:  Left shoulder series today. FINDINGS: Proximal left humerus fracture as described on the shoulder series and no new features identified. Distal to that fracture of the left humerus appears intact. Bone mineralization is within normal limits for age. Alignment at the elbow appears grossly maintained. Visible left scapula, clavicle, and ribs appear intact. IMPRESSION: 1. Proximal left humerus fracture as described on the Shoulder Series. 2. No other fracture identified about the left humerus. Electronically Signed   By: Genevie Ann M.D.   On: 04/20/2022 05:39   DG Shoulder Left  Result Date:  04/20/2022 CLINICAL DATA:  88 year old female status post fall while walking. EXAM: LEFT SHOULDER - 2+ VIEW COMPARISON:  Chest radiographs 12/31/2020 and earlier. FINDINGS: Bone mineralization is within normal limits for age. Comminuted fracture of the proximal left humerus through the humeral neck with nearly 1 full shaft width anterior displacement and approximately 17 mm of overriding. Humeral head articular surface appears to remain located within the glenoid. No superimposed left clavicle or scapula fracture identified. Visible left ribs and chest appear stable and intact. IMPRESSION: Comminuted fracture of the proximal left humerus neck with nearly 1 full shaft width anterior displacement and 17 mm of overriding. Electronically Signed   By: Genevie Ann M.D.   On: 04/20/2022 05:38        Scheduled Meds:  acetaminophen  1,000 mg Oral BID   enoxaparin (LOVENOX) injection  30 mg Subcutaneous Q24H   polyvinyl alcohol  1 drop Both Eyes Daily   senna-docusate  1 tablet Oral QHS   sodium chloride flush  3 mL Intravenous Q12H   zinc oxide  1 Application Topical TID   Continuous Infusions:   LOS: 1 day    Time spent: 35 minutes,     Evita Merida A Paulette [place], MD [facility]   If 7PM-7AM, please contact night-coverage [email or web address]  04/21/2022, 7:02 AM

## 2022-04-21 NOTE — Evaluation (Signed)
Occupational Therapy Evaluation Patient Details Name: Karen [name] MRN: [id number] DOB: [birth year] Today's Date: 04/21/2022   History of Present Illness 88 yo female with PMH significant for severe demetnia, HTN, HLD, carotid artery stenosis, endometrial CA, bladder tumor and GERD admitted after fall at her ALF. She sustained a L humeral and L radius fx to be managed non-operatively with sugar tong splint and sling.   Clinical Impression   88 yo female with above detailed PMH admitted from ALF for evaluation of injuries sustained after fall. Pt with baseline severe dementia and unable to provide hx. Per chart pt ambulates with RW at baseline. She is requiring 2 person assist for all mobility due to balance impairments, cognitive deficits, and NWB to LUE limiting ability to use AE. Pt with significant incontinence throughout session. She was able to participate in transfer to bedside chair and later [facility] to assist with cleaning and changing clothes. Pt became less responsive at end of session. BP assessed during return transfer to bed with final reading 94/45 (59) and HR 55. Placed pt in trendelenburg and reassessed BP 150/72 (97) with HR 77. Pt becoming more arousable through remains lethargic and positioned more upright in bed with pillows for comfort under LUE and R side. Final BP reading 151/72 (94) and HR 71. Notified RN of concerns with BP and  pt status. Will benefit from continued OT services to progress tolerance of mobility as well as assist with d/c planning. She would benefit from returning to her most familiar environment given level of dementia.       Recommendations for follow up therapy are one component of a multi-disciplinary discharge planning process, led by the attending physician.  Recommendations may be updated based on patient status, additional functional criteria and insurance authorization.   Follow Up Recommendations  Other (comment) (trial HHOT, recommend returning to  ALF for familiar environment given pt's perseveration over needing to return to her room throughout session)    Assistance Recommended at Discharge Frequent or constant Supervision/Assistance  Patient can return home with the following A lot of help with walking and/or transfers;A lot of help with bathing/dressing/bathroom;Assistance with feeding;Direct supervision/assist for medications management    Functional Status Assessment  Patient has had a recent decline in their functional status and/or demonstrates limited ability to make significant improvements in function in a reasonable and predictable amount of time  Equipment Recommendations  None recommended by OT       Precautions / Restrictions Precautions Precautions: Fall Required Braces or Orthoses: Splint/Cast;Sling Restrictions Weight Bearing Restrictions: Yes LUE Weight Bearing: Non weight bearing      Mobility Bed Mobility Overal bed mobility: Needs Assistance Bed Mobility: Supine to Sit, Sit to Supine     Supine to sit: +2 for physical assistance, Max assist, HOB elevated Sit to supine: Total assist, +2 for physical assistance        Transfers Overall transfer level: Needs assistance Equipment used: 2 person hand held assist Transfers: Sit to/from Stand, Bed to chair/wheelchair/BSC Sit to Stand: Mod assist, Min assist, +2 physical assistance Stand pivot transfers: +2 physical assistance, Min assist                Balance Overall balance assessment: History of Falls               ADL either performed or assessed with clinical judgement   ADL Overall ADL's : Needs assistance/impaired Eating/Feeding: Minimal assistance;Bed level   Grooming: Wash/dry face;Moderate assistance;Sitting   Upper Body  Bathing: Moderate assistance;Sitting   Lower Body Bathing: Maximal assistance;Sit to/from stand   Upper Body Dressing : Maximal assistance;Sitting;Cueing for UE precautions   Lower Body Dressing: Total  assistance   Toilet Transfer: +2 for physical assistance;Minimal assistance;BSC/3in1   Toileting- Clothing Manipulation and Hygiene: Total assistance;+2 for physical assistance;Sit to/from stand       Functional mobility during ADLs: Minimal assistance;+2 for physical assistance       Vision Ability to See in Adequate Light: 0 Adequate Patient Visual Report: Other (comment) (pt is a poor historian, keeping eyes closed towards end of session due to drop in BP)              Pertinent Vitals/Pain Pain Assessment Pain Assessment: Faces Faces Pain Scale: Hurts even more Pain Location: LUE Pain Intervention(s): Limited activity within patient's tolerance, Monitored during session, Repositioned     Hand Dominance Right   Extremity/Trunk Assessment Upper Extremity Assessment Upper Extremity Assessment: LUE deficits/detail LUE Deficits / Details: sugar tong splint and sling in place, able to open and close fingers of L hand, note baseline contractures in IPs and MPs with digits resting in slight flexed posture with inability to acheive full finger extension in either hand likely due to arthritis   Lower Extremity Assessment Lower Extremity Assessment: Defer to PT evaluation       Communication Communication Communication: No difficulties   Cognition Arousal/Alertness: Lethargic (very drowsy by end of session, significant change in BP with mobility) Behavior During Therapy: Anxious, Restless Overall Cognitive Status: History of cognitive impairments - at baseline                   General Comments: perseverating over need to return to her room, unable to carry over attempts to reorient to hospital or situation     General Comments  confused, difficult to reorient but able to redirect, incontinence of bladder, BP drop with OOB activity            Home Living Family/patient expects to be discharged to:: Assisted living             Home Equipment: Rolling  Walker (2 wheels)          Prior Functioning/Environment Prior Level of Function : Needs assist;Patient poor historian/Family not available  Cognitive Assist : Mobility (cognitive);ADLs (cognitive) Mobility (Cognitive): Step by step cues ADLs (Cognitive): Step by step cues       Mobility Comments: per chart pt ambulates with walker at baseline, lives in ALF, no family present to provide full hx          OT Problem List: Decreased activity tolerance;Impaired balance (sitting and/or standing);Decreased knowledge of precautions      OT Treatment/Interventions: Self-care/ADL training;Therapeutic activities;Patient/family education    OT Goals(Current goals can be found in the care plan section) Acute Rehab OT Goals Patient Stated Goal: Return home OT Goal Formulation: Patient unable to participate in goal setting Time For Goal Achievement: 05/05/22 Potential to Achieve Goals: Poor ADL Goals Pt Will Perform Upper Body Bathing: with mod assist;with caregiver independent in assisting;sitting Pt Will Perform Upper Body Dressing: with mod assist;with caregiver independent in assisting;sitting  OT Frequency: Min 2X/week       AM-PAC OT "6 Clicks" Daily Activity     Outcome Measure Help from another person eating meals?: A Lot Help from another person taking care of personal grooming?: A Lot Help from another person toileting, which includes using toliet, bedpan, or urinal?: Total Help from another person  bathing (including washing, rinsing, drying)?: Total Help from another person to put on and taking off regular upper body clothing?: A Lot Help from another person to put on and taking off regular lower body clothing?: Total 6 Click Score: 9   End of Session Nurse Communication: Mobility status;Other (comment) (drop in BP and significant urinary incontinence)  Activity Tolerance: Treatment limited secondary to medical complications (Comment) Patient left: in bed;with call  bell/phone within reach;with bed alarm set  OT Visit Diagnosis: Other abnormalities of gait and mobility (R26.89);History of falling (Z91.81)                Time: 8329-1916 OT Time Calculation (min): 40 min Charges:  OT General Charges $OT Visit: 1 Visit OT Evaluation $OT Eval Low Complexity: 1 Low OT Treatments $Self Care/Home Management : 8-22 mins    Naomie Dean Nikeshia Keetch, OTR/L 04/21/2022, 3:50 PM

## 2022-04-21 NOTE — TOC CAGE-AID Note (Signed)
Transition of Care [facility]) - CAGE-AID Screening   Patient Details  Name: Karen [name] MRN: [id number] Date of Birth: Aug 18, 1917  Clinical Narrative:  Patient admitted after a fall, baseline dementia from memory care unit and currently only oriented x1. Unable to complete screening at this time.  CAGE-AID Screening: Substance Abuse Screening unable to be completed due to: : Patient unable to participate

## 2022-04-21 NOTE — Evaluation (Signed)
Physical Therapy Evaluation Patient Details Name: Karen [name] MRN: [id number] DOB: 12/09/1917 Today's Date: 04/21/2022  History of Present Illness  The pt is an 88 yo female admitted 11/11 after fall at ALF. She sustained a L humeral and L radius fx to be managed non-operatively with sugar tong splint and sling. PMH includes: severe dementia, HTN, HLD, carotid artery stenosis, endometrial CA, bladder tumor and GERD.   Clinical Impression  Pt in bed upon arrival of PT, agreeable to evaluation at this time. The pt is an unreliable historian due to severity of dementia, reports she does use a RW typically. The pt was able to follow simple commands, but is difficult to re-orient and demos poor awareness of deficits or memory to retain information regarding fall and LUE fx. The pt was able to complete multiple stand-pivot transfers with HHA in RUE and needed minA of 1-2 through session when alert, after transition to [facility] due to continued urinary incontinence, pt with near syncopal event requiring mod-maxA of 2 to complete transfer from [facility] to bed. Given pt is from assisted living, she would be safe to return with assist for any OOB mobility and continued therapies at facility, if her assisted living facility is unable to provide needed assist, may need SNF rehab after d/c.   VITALS:  - supine after near syncopal event on BSC - BP: 94/45 (59); HR: 55bpm - supine in trendelenburg - BP: 150/72 (97); HR: 77bpm - semi-reclined in bed at end of session - BP: 151/72 (94); HR: 71bpm      Recommendations for follow up therapy are one component of a multi-disciplinary discharge planning process, led by the attending physician.  Recommendations may be updated based on patient status, additional functional criteria and insurance authorization.  Follow Up Recommendations Home health PT (at assisted living)      Assistance Recommended at Discharge Frequent or constant Supervision/Assistance  Patient can  return home with the following  A lot of help with walking and/or transfers;A lot of help with bathing/dressing/bathroom;Assistance with cooking/housework;Direct supervision/assist for medications management;Assistance with feeding;Direct supervision/assist for financial management;Assist for transportation;Help with stairs or ramp for entrance    Equipment Recommendations None recommended by PT  Recommendations for Other Services       Functional Status Assessment Patient has had a recent decline in their functional status and demonstrates the ability to make significant improvements in function in a reasonable and predictable amount of time.     Precautions / Restrictions Precautions Precautions: Fall Precaution Comments: dementia Required Braces or Orthoses: Splint/Cast;Sling Restrictions Weight Bearing Restrictions: Yes LUE Weight Bearing: Non weight bearing      Mobility  Bed Mobility Overal bed mobility: Needs Assistance Bed Mobility: Supine to Sit, Sit to Supine     Supine to sit: +2 for physical assistance, Max assist, HOB elevated Sit to supine: Total assist, +2 for physical assistance        Transfers Overall transfer level: Needs assistance Equipment used: 2 person hand held assist, 1 person hand held assist Transfers: Sit to/from Stand, Bed to chair/wheelchair/BSC Sit to Stand: Mod assist, Min assist, +2 physical assistance Stand pivot transfers: Mod assist, Max assist, +2 physical assistance Step pivot transfers: Min assist       General transfer comment: pt initially more alert, able to complete sit-stand with assist to RUE only or with minA of 2, taking small steps with HHA to pivot from bed to recliner to [facility]. after drop in BP, pt needing mod-maxA of 2  to pivot back to bed    Ambulation/Gait Ambulation/Gait assistance: Min assist Gait Distance (Feet): 3 Feet Assistive device: 1 person hand held assist Gait Pattern/deviations: Step-to pattern,  Decreased stride length Gait velocity: decreased     General Gait Details: small steps with HHA to RUE to take pivotal steps    Balance Overall balance assessment: History of Falls                                           Pertinent Vitals/Pain Pain Assessment Pain Assessment: Faces Faces Pain Scale: Hurts even more Pain Location: LUE Pain Descriptors / Indicators: Sore Pain Intervention(s): Limited activity within patient's tolerance, Monitored during session, Repositioned    Home Living Family/patient expects to be discharged to:: Assisted living                 Home Equipment: Conservation officer, nature (2 wheels) Additional Comments: pt from assisted living, no family present at time of eval    Prior Function Prior Level of Function : Needs assist;Patient poor historian/Family not available  Cognitive Assist : Mobility (cognitive);ADLs (cognitive) Mobility (Cognitive): Step by step cues ADLs (Cognitive): Step by step cues       Mobility Comments: per chart pt ambulates with walker at baseline, lives in ALF, no family present to provide full hx       Hand Dominance   Dominant Hand: Right    Extremity/Trunk Assessment   Upper Extremity Assessment Upper Extremity Assessment: Defer to OT evaluation LUE Deficits / Details: sugar tong splint and sling in place, able to open and close fingers of L hand, note baseline contractures in IPs and MPs with digits resting in slight flexed posture with inability to acheive full finger extension in either hand likely due to arthritis    Lower Extremity Assessment Lower Extremity Assessment: Generalized weakness    Cervical / Trunk Assessment Cervical / Trunk Assessment: Kyphotic  Communication   Communication: No difficulties  Cognition Arousal/Alertness: Lethargic, Awake/alert (very drowsy by end of session, significant change in BP with mobility) Behavior During Therapy: Anxious, Restless Overall Cognitive  Status: History of cognitive impairments - at baseline                                 General Comments: perseverating over need to return to her room, unable to carry over attempts to re-orient to hospital or situation        General Comments General comments (skin integrity, edema, etc.): confused, difficult to reorient but able to redirect, incontinence of bladder, BP drop with OOB activity (low of 95/45 with MAP 59, improved to 150/72 after return to bed)        Assessment/Plan    PT Assessment Patient needs continued PT services  PT Problem List Decreased strength;Decreased range of motion;Decreased activity tolerance;Decreased balance;Decreased mobility;Decreased coordination;Decreased cognition;Decreased safety awareness       PT Treatment Interventions DME instruction;Gait training;Stair training;Functional mobility training;Therapeutic activities;Therapeutic exercise;Balance training;Patient/family education    PT Goals (Current goals can be found in the Care Plan section)  Acute Rehab PT Goals Patient Stated Goal: to return to her room PT Goal Formulation: With patient Time For Goal Achievement: 05/05/22 Potential to Achieve Goals: Good    Frequency Min 2X/week     Co-evaluation PT/OT/SLP Co-Evaluation/Treatment: Yes Reason for Co-Treatment: Complexity of  the patient's impairments (multi-system involvement);Necessary to address cognition/behavior during functional activity;For patient/therapist safety;To address functional/ADL transfers PT goals addressed during session: Mobility/safety with mobility;Balance;Strengthening/ROM OT goals addressed during session: ADL's and self-care       AM-PAC PT "6 Clicks" Mobility  Outcome Measure Help needed turning from your back to your side while in a flat bed without using bedrails?: Total Help needed moving from lying on your back to sitting on the side of a flat bed without using bedrails?: Total Help  needed moving to and from a bed to a chair (including a wheelchair)?: Total Help needed standing up from a chair using your arms (e.g., wheelchair or bedside chair)?: Total Help needed to walk in hospital room?: Total Help needed climbing 3-5 steps with a railing? : Total 6 Click Score: 6    End of Session Equipment Utilized During Treatment: Gait belt Activity Tolerance: Treatment limited secondary to medical complications (Comment) (soft BP) Patient left: in bed;with call bell/phone within reach;with bed alarm set Nurse Communication: Mobility status PT Visit Diagnosis: Other abnormalities of gait and mobility (R26.89);Repeated falls (R29.6);Muscle weakness (generalized) (M62.81)    Time: 1761-6073 PT Time Calculation (min) (ACUTE ONLY): 38 min   Charges:   PT Evaluation $PT Eval Moderate Complexity: 1 Mod          West Carbo, PT, DPT   Acute Rehabilitation Department  Sandra Cockayne 04/21/2022, 4:35 PM

## 2022-04-22 ENCOUNTER — Other Ambulatory Visit (HOSPITAL_COMMUNITY): Payer: Self-pay

## 2022-04-22 DIAGNOSIS — I6523 Occlusion and stenosis of bilateral carotid arteries: Secondary | ICD-10-CM

## 2022-04-22 DIAGNOSIS — S52002A Unspecified fracture of upper end of left ulna, initial encounter for closed fracture: Secondary | ICD-10-CM | POA: Diagnosis not present

## 2022-04-22 DIAGNOSIS — R1310 Dysphagia, unspecified: Secondary | ICD-10-CM | POA: Diagnosis not present

## 2022-04-22 DIAGNOSIS — S42202A Unspecified fracture of upper end of left humerus, initial encounter for closed fracture: Secondary | ICD-10-CM | POA: Diagnosis not present

## 2022-04-22 DIAGNOSIS — S52502A Unspecified fracture of the lower end of left radius, initial encounter for closed fracture: Secondary | ICD-10-CM | POA: Diagnosis not present

## 2022-04-22 DIAGNOSIS — R221 Localized swelling, mass and lump, neck: Secondary | ICD-10-CM | POA: Diagnosis not present

## 2022-04-22 DIAGNOSIS — N179 Acute kidney failure, unspecified: Secondary | ICD-10-CM | POA: Diagnosis not present

## 2022-04-22 MED ORDER — HYDROCODONE-ACETAMINOPHEN 5-325 MG PO TABS
1.0000 | ORAL_TABLET | Freq: Four times a day (QID) | ORAL | 0 refills | Status: DC | PRN
  Filled 2022-04-22: qty 10, 3d supply, fill #0

## 2022-04-22 MED ORDER — HYDROCODONE-ACETAMINOPHEN 5-325 MG PO TABS
1.0000 | ORAL_TABLET | Freq: Four times a day (QID) | ORAL | 0 refills | Status: AC | PRN
  Filled 2022-04-22: qty 10, 3d supply, fill #0

## 2022-04-22 MED ORDER — POLYETHYLENE GLYCOL 3350 17 GM/SCOOP PO POWD
17.0000 g | Freq: Every day | ORAL | 0 refills | Status: AC
  Filled 2022-04-22: qty 238, 14d supply, fill #0

## 2022-04-22 MED ORDER — AMLODIPINE BESYLATE 5 MG PO TABS
5.0000 mg | ORAL_TABLET | Freq: Every day | ORAL | Status: DC
  Administered 2022-04-22: 5 mg via ORAL
  Filled 2022-04-22: qty 1

## 2022-04-22 MED ORDER — AMLODIPINE BESYLATE 5 MG PO TABS
5.0000 mg | ORAL_TABLET | Freq: Every day | ORAL | 0 refills | Status: AC
  Filled 2022-04-22: qty 30, 30d supply, fill #0

## 2022-04-22 NOTE — Discharge Summary (Signed)
Physician Discharge Summary   Patient: Karen [name] MRN: [id number] DOB: 05-Dec-1917  Admit date:     04/20/2022  Discharge date: 04/22/22  Discharge Physician: Jimmy Picket Elanie Hammitt   PCP: Leeroy Cha, MD   Recommendations at discharge:    Continue pain control with acetaminophen and hydrocodone Non weight bearing to left upper extremity  Sling to left upper extremity Follow up with Dr Fara Olden in 7 to 10 days Follow up with Orthopedics as scheduled.  Patient was placed on Amlodipine for blood pressure control.   I spoke with patient's daughter in law at the bedside, we talked in detail about patient's condition, plan of care and prognosis and all questions were addressed.   Discharge Diagnoses: Principal Problem:   Traumatic closed displaced fracture of upper end of humerus, left, initial encounter Active Problems:   Left ulnar fracture   Left radial fracture   Essential hypertension   AKI (acute kidney injury) ([place])   Dementia without behavioral disturbance (HCC)   Normocytic anemia   Carotid stenosis  Resolved Problems:   * No resolved hospital problems. [facility] Course: Karen [name] was admitted to the hospital with the working diagnosis of left proxima humerus, proximal distal ulna and distal radius fracture.   88 yo female with the past medical history of hypertension, dyslipidemia, depression, carotid stenosis, endometrial cancer and bladder tumor who presented after a mechanical fall. Apparently she fell out of the bathroom while trying to reach her walker. Post fall left arm and shoulder pain. On her initial physical examination her blood pressure was 139/54, HR 68, RR 22 and 02 saturation 94%, lungs with no wheezing or rales, heart with S1 and S2 present and rhythmic, abdomen with no distention and no lower extremity edema.   Na 136, K 4,6 Cl 107 bicarbonate 20, glucose 122, bun 31 cr 1,31  Wbc 9,1 hgb 10.8 plt 178  Urine analysis with SG  1,016, negative protein, negative leukocytes.   Left forearm with comminuted proximal ulna fracture through both the olecranon and trochlea with mild displacement. Distal radius and ulna fractures.  Left proximal humerus fracture.  Comminuted fracture of the proximal left humerus neck with nearly 1 full shafr width anterior displacement and 17 mm of overriding  Highly comminuted distal radius and ulna fractures with impaction, between 1/2 and 1 fill shaft width dorsal displacement, DRU and radiocarpal joint involvement.   Patient was placed on analgesics and orthopedics was consulted.  Recommended cast to left forearm and pain control, non weight bearing. Non operative.  Follow up as outpatient.      Assessment and Plan: * Traumatic closed displaced fracture of upper end of humerus, left, initial encounter Patient will continue PT and OT at assisted living, continue pain control with hydroxyacetone.  She has been deemed to be not operative per orthopedics.  Non weightbearing   Left ulnar fracture Continue pain control, sugar -tong then cast likely per orthopdeics.  Non weightbearing per orthopedics Continue PT and OT.   Left radial fracture Continue pain control, sugar -tong then cast likely per orthopdeics.  Non weightbearing per orthopedics Continue PT and OT.   Essential hypertension Patient will continue blood pressure control with amlodipine.   AKI (acute kidney injury) ([place]) Hyponatremia.   Patient received IV fluids with improvement in renal function. At the time of her discharge her serum cr is 0.93 with K at 4,4 and serum bicarbonate at 19.  Na 132.  Dementia without behavioral disturbance (HCC) No agitation. Continue  PT and OT at assisted living facility.  Normocytic anemia Her Hgb has been stable at 9.5 Plan to follow up as outpatient.   Carotid stenosis Follow up as outpatient.          Consultants: orthopedics  Procedures performed: none    Disposition: Assisted living Diet recommendation:  Regular diet DISCHARGE MEDICATION: Allergies as of 04/22/2022       Reactions   Lisinopril Swelling   Celebrex [celecoxib] Hives, Itching        Medication List     STOP taking these medications    fish oil-omega-3 fatty acids 1000 MG capsule       TAKE these medications    acetaminophen 500 MG tablet Commonly known as: TYLENOL Take 1,000 mg by mouth in the morning and at bedtime.   amLODipine 5 MG tablet Commonly known as: NORVASC Take 1 tablet (5 mg total) by mouth daily.   calcium carbonate 500 MG chewable tablet Commonly known as: TUMS - dosed in mg elemental calcium Chew 1 tablet by mouth 4 (four) times daily as needed for heartburn.   HYDROcodone-acetaminophen 5-325 MG tablet Commonly known as: NORCO/VICODIN Take 1 tablet by mouth every 6 (six) hours as needed for severe pain.   nystatin powder Commonly known as: MYCOSTATIN/NYSTOP Apply 1 Application topically 3 (three) times daily as needed.   polyethylene glycol 17 g packet Commonly known as: MIRALAX / GLYCOLAX Take 17 g by mouth daily.   polyvinyl alcohol 1.4 % ophthalmic solution Commonly known as: LIQUIFILM TEARS Place 1 drop into both eyes daily.   Systane Balance 0.6 % Soln Generic drug: Propylene Glycol Place 1 drop into both eyes daily as needed (dry eyes).   Vitamin D3 50 MCG (2000 UT) Tabs Take 50 mcg by mouth daily.   zinc oxide 20 % ointment Apply 1 Application topically in the morning, at noon, and at bedtime.        Discharge Exam: Filed Weights   04/20/22 0353  Weight: 61.2 kg   BP (!) 195/76 (BP Location: Right Arm)   Pulse 79   Temp (!) 97.4 F (36.3 C) (Oral)   Resp 16   Ht '5\' 4"'$  (1.626 m)   Wt 61.2 kg   SpO2 93%   BMI 23.17 kg/m   Patient with no chest pain or dyspnea, left arm pain is control  Neurology awake and alert ENT with mild pallor Cardiovascular with S1 and S2 present and rhythmic, positive  systolic murmur at the apex Respiratory with no rales or wheezing Abdomen with no distention  No lower extremity edema   Condition at discharge: stable  The results of significant diagnostics from this hospitalization (including imaging, microbiology, ancillary and laboratory) are listed below for reference.   Imaging Studies: DG Forearm Left  Result Date: 04/20/2022 CLINICAL DATA:  88 year old female status post fall while walking. EXAM: LEFT FOREARM - 1 VIEW COMPARISON:  Left wrist series today. FINDINGS: Only one view could be obtained for this series. Comminuted and dorsally impacted distal radius and ulna fractures are described on the wrist series separately. Bone mineralization is within normal limits for age. Left ulna and radius midshaft appear intact. Grossly intact proximal radius. Comminuted proximal left ulna fracture through the both the olecranon and trochlea with mild displacement. No elbow joint dislocation suspected. Regional soft tissue swelling. IMPRESSION: 1. Single forearm view demonstrating comminuted Proximal Ulna fracture through both the olecranon and trochlea with mild displacement. No elbow dislocation. 2. Distal radius and ulna fractures  are described on the Wrist Series separately. No other radius fracture identified. Electronically Signed   By: Genevie Ann M.D.   On: 04/20/2022 05:44   DG Wrist Complete Left  Result Date: 04/20/2022 CLINICAL DATA:  88 year old female status post fall while walking. EXAM: LEFT WRIST - COMPLETE 3+ VIEW COMPARISON:  None Available. FINDINGS: Bone mineralization is within normal limits for age. Highly comminuted and impacted distal left radius and ulna fractures with DRU and radiocarpal joint involvement. Dorsal displacement of between 1/2 and 1 full shaft width at each fracture site. Regional soft tissue swelling. Impaction of up to 10 mm. Carpal bone alignment appears maintained. Metacarpals appear intact. IMPRESSION: Highly comminuted  distal left radius and ulna fractures with impaction, between 1/2 and 1 full shaft width dorsal displacement, DRU and radiocarpal joint involvement. Electronically Signed   By: Genevie Ann M.D.   On: 04/20/2022 05:41   DG Humerus Left  Result Date: 04/20/2022 CLINICAL DATA:  88 year old female status post fall while walking. EXAM: LEFT HUMERUS - 2+ VIEW COMPARISON:  Left shoulder series today. FINDINGS: Proximal left humerus fracture as described on the shoulder series and no new features identified. Distal to that fracture of the left humerus appears intact. Bone mineralization is within normal limits for age. Alignment at the elbow appears grossly maintained. Visible left scapula, clavicle, and ribs appear intact. IMPRESSION: 1. Proximal left humerus fracture as described on the Shoulder Series. 2. No other fracture identified about the left humerus. Electronically Signed   By: Genevie Ann M.D.   On: 04/20/2022 05:39   DG Shoulder Left  Result Date: 04/20/2022 CLINICAL DATA:  88 year old female status post fall while walking. EXAM: LEFT SHOULDER - 2+ VIEW COMPARISON:  Chest radiographs 12/31/2020 and earlier. FINDINGS: Bone mineralization is within normal limits for age. Comminuted fracture of the proximal left humerus through the humeral neck with nearly 1 full shaft width anterior displacement and approximately 17 mm of overriding. Humeral head articular surface appears to remain located within the glenoid. No superimposed left clavicle or scapula fracture identified. Visible left ribs and chest appear stable and intact. IMPRESSION: Comminuted fracture of the proximal left humerus neck with nearly 1 full shaft width anterior displacement and 17 mm of overriding. Electronically Signed   By: Genevie Ann M.D.   On: 04/20/2022 05:38    Microbiology: Results for orders placed or performed during the hospital encounter of 12/31/20  Resp Panel by RT-PCR (Flu A&B, Covid) Nasopharyngeal Swab     Status: None    Collection Time: 01/01/21  1:55 PM   Specimen: Nasopharyngeal Swab; Nasopharyngeal(NP) swabs in vial transport medium  Result Value Ref Range Status   SARS Coronavirus 2 by RT PCR NEGATIVE NEGATIVE Final    Comment: (NOTE) SARS-CoV-2 target nucleic acids are NOT DETECTED.  The SARS-CoV-2 RNA is generally detectable in upper respiratory specimens during the acute phase of infection. The lowest concentration of SARS-CoV-2 viral copies this assay can detect is 138 copies/mL. A negative result does not preclude SARS-Cov-2 infection and should not be used as the sole basis for treatment or other patient management decisions. A negative result may occur with  improper specimen collection/handling, submission of specimen other than nasopharyngeal swab, presence of viral mutation(s) within the areas targeted by this assay, and inadequate number of viral copies(<138 copies/mL). A negative result must be combined with clinical observations, patient history, and epidemiological information. The expected result is Negative.  Fact Sheet for Patients:  EntrepreneurPulse.com.au  Fact Sheet for  Healthcare Providers:  IncredibleEmployment.be  This test is no t yet approved or cleared by the [place] and  has been authorized for detection and/or diagnosis of SARS-CoV-2 by FDA under an Emergency Use Authorization (EUA). This EUA will remain  in effect (meaning this test can be used) for the duration of the COVID-19 declaration under Section 564(b)(1) of the Act, 21 U.S.C.section 360bbb-3(b)(1), unless the authorization is terminated  or revoked sooner.       Influenza A by PCR NEGATIVE NEGATIVE Final   Influenza B by PCR NEGATIVE NEGATIVE Final    Comment: (NOTE) The Xpert Xpress SARS-CoV-2/FLU/RSV plus assay is intended as an aid in the diagnosis of influenza from Nasopharyngeal swab specimens and should not be used as a sole basis for treatment.  Nasal washings and aspirates are unacceptable for Xpert Xpress SARS-CoV-2/FLU/RSV testing.  Fact Sheet for Patients: EntrepreneurPulse.com.au  Fact Sheet for Healthcare Providers: IncredibleEmployment.be  This test is not yet approved or cleared by the [place] FDA and has been authorized for detection and/or diagnosis of SARS-CoV-2 by FDA under an Emergency Use Authorization (EUA). This EUA will remain in effect (meaning this test can be used) for the duration of the COVID-19 declaration under Section 564(b)(1) of the Act, 21 U.S.C. section 360bbb-3(b)(1), unless the authorization is terminated or revoked.  Performed at [facility], [place] [address], [place], [place] [address]     Labs: CBC: Recent Labs  Lab 04/20/22 0400 04/21/22 0737  WBC 9.1 7.3  NEUTROABS 6.7  --   HGB 10.8* 9.5*  HCT 34.4* 31.1*  MCV 92.5 93.7  PLT 178 749*   Basic Metabolic Panel: Recent Labs  Lab 04/20/22 0400 04/21/22 0737  NA 136 132*  K 4.6 4.4  CL 107 102  CO2 20* 19*  GLUCOSE 122* 102*  BUN 31* 19  CREATININE 1.31* 0.93  CALCIUM 9.2 8.4*   Liver Function Tests: Recent Labs  Lab 04/20/22 0400  AST 20  ALT 11  ALKPHOS 53  BILITOT 0.3  PROT 6.3*  ALBUMIN 3.5   CBG: No results for input(s): "GLUCAP" in the last 168 hours.  Discharge time spent: greater than 30 minutes.  Signed: Tawni Millers, MD [facility] 04/22/2022

## 2022-04-22 NOTE — NC FL2 (Signed)
Karen [name]     IDENTIFICATION  Patient Name: Karen [name] Birthdate: April 26, 1918 Sex: female Admission Date (Current Location): 04/20/2022  [facility] and [place] Number:  Herbalist and Address:  [facility], [name] [address], [place],  [address]      Provider Number: [id number]  Attending Physician Name and Address:  Tawni Millers,*  Relative Name and Phone Number:  [place], Bossi (Relative)   [phone]    Current Level of Care: Hospital Recommended Level of Care: [place] Prior Approval Number:    Date Approved/Denied:   PASRR Number:    Discharge Plan: Other (Comment) (ALF)    Current Diagnoses: Patient Active Problem List   Diagnosis Date Noted   Traumatic closed displaced fracture of upper end of humerus, left, initial encounter 04/20/2022   Fall 04/20/2022   Left ulnar fracture 04/20/2022   Left radial fracture 04/20/2022   Dementia without behavioral disturbance ([place]) 04/20/2022   Essential hypertension 04/20/2022   Normocytic anemia 04/20/2022   AKI (acute kidney injury) ([place]) 12/31/2020   Chronic kidney disease, stage 3a ([place]) 12/31/2020   Carotid stenosis 03/29/2014   Aftercare following surgery of the circulatory system 03/29/2014   Aftercare following surgery of the circulatory system, NEC 02/23/2013   Occlusion and stenosis of carotid artery without mention of cerebral infarction 02/18/2012    Orientation RESPIRATION BLADDER Height & Weight     Self  Normal Incontinent, External catheter Weight: 135 lb (61.2 kg) Height:  '5\' 4"'$  (162.6 cm)  BEHAVIORAL SYMPTOMS/MOOD NEUROLOGICAL BOWEL NUTRITION STATUS      Continent Diet (Regular diet)  AMBULATORY STATUS COMMUNICATION OF NEEDS Skin     Verbally Normal                       Personal Care Assistance Level of Assistance              Functional Limitations Info  Sight, Hearing, Speech  Sight Info: Impaired Hearing Info: Impaired Speech Info: Adequate    SPECIAL CARE FACTORS FREQUENCY  PT (By licensed PT)     PT Frequency: 3x a week              Contractures Contractures Info: Not present    Additional Factors Info  Code Status, Allergies Code Status Info: DNR Allergies Info: Lisinopril, Celebrex (celecoxib)           Current Medications (04/22/2022):  This is the current hospital active medication list Current Facility-Administered Medications  Medication Dose Route Frequency Provider Last Rate Last Admin   acetaminophen (TYLENOL) tablet 650 mg  650 mg Oral Q6H PRN Fuller Plan A, MD   650 mg at 04/21/22 1855   Or   acetaminophen (TYLENOL) suppository 650 mg  650 mg Rectal Q6H PRN Fuller Plan A, MD       acetaminophen (TYLENOL) tablet 1,000 mg  1,000 mg Oral BID Tamala Julian, Rondell A, MD   1,000 mg at 04/22/22 1042   albuterol (PROVENTIL) (2.5 MG/3ML) 0.083% nebulizer solution 2.5 mg  2.5 mg Nebulization Q6H PRN Fuller Plan A, MD       amLODipine (NORVASC) tablet 5 mg  5 mg Oral Daily Arrien, Jimmy Picket, MD   5 mg at 04/22/22 1045   calcium carbonate (TUMS - dosed in mg elemental calcium) chewable tablet 200 mg of elemental calcium  1 tablet Oral QID PRN Norval Morton, MD  enoxaparin (LOVENOX) injection 30 mg  30 mg Subcutaneous Q24H Smith, Rondell A, MD   30 mg at 04/22/22 1042   fentaNYL (SUBLIMAZE) injection 25 mcg  25 mcg Intravenous Q2H PRN Fuller Plan A, MD   25 mcg at 04/20/22 1641   hydrALAZINE (APRESOLINE) injection 2 mg  2 mg Intravenous Q4H PRN Fuller Plan A, MD       HYDROcodone-acetaminophen (NORCO/VICODIN) 5-325 MG per tablet 1-2 tablet  1-2 tablet Oral Q4H PRN Fuller Plan A, MD   1 tablet at 04/21/22 1130   polyethylene glycol (MIRALAX / GLYCOLAX) packet 17 g  17 g Oral Daily Regalado, Belkys A, MD   17 g at 04/22/22 1042   polyvinyl alcohol (LIQUIFILM TEARS) 1.4 % ophthalmic solution 1 drop  1 drop Both Eyes Daily  Smith, Rondell A, MD   1 drop at 04/22/22 1043   senna-docusate (Senokot-S) tablet 1 tablet  1 tablet Oral BID Regalado, Belkys A, MD   1 tablet at 04/22/22 1042   sodium chloride flush (NS) 0.9 % injection 3 mL  3 mL Intravenous Q12H Smith, Rondell A, MD   3 mL at 04/22/22 1044   zinc oxide 20 % ointment 1 Application  1 Application Topical TID Norval Morton, MD   1 Application at 50/09/38 1043     Discharge Medications: TAKE these medications     acetaminophen 500 MG tablet Commonly known as: TYLENOL Take 1,000 mg by mouth in the morning and at bedtime.    amLODipine 5 MG tablet Commonly known as: NORVASC Take 1 tablet (5 mg total) by mouth daily.    calcium carbonate 500 MG chewable tablet Commonly known as: TUMS - dosed in mg elemental calcium Chew 1 tablet by mouth 4 (four) times daily as needed for heartburn.    HYDROcodone-acetaminophen 5-325 MG tablet Commonly known as: NORCO/VICODIN Take 1 tablet by mouth every 6 (six) hours as needed for severe pain.    nystatin powder Commonly known as: MYCOSTATIN/NYSTOP Apply 1 Application topically 3 (three) times daily as needed.    polyethylene glycol 17 g packet Commonly known as: MIRALAX / GLYCOLAX Take 17 g by mouth daily.    polyvinyl alcohol 1.4 % ophthalmic solution Commonly known as: LIQUIFILM TEARS Place 1 drop into both eyes daily.    Systane Balance 0.6 % Soln Generic drug: Propylene Glycol Place 1 drop into both eyes daily as needed (dry eyes).    Vitamin D3 50 MCG (2000 UT) Tabs Take 50 mcg by mouth daily.    zinc oxide 20 % ointment Apply 1 Application topically in the morning, at noon, and at bedtime.    Relevant Imaging Results:  Relevant Lab Results:   Additional Information SS# [id number]  Reece Agar, [place]

## 2022-04-22 NOTE — Assessment & Plan Note (Addendum)
Patient will continue PT and OT at assisted living, continue pain control with hydroxyacetone.  She has been deemed to be not operative per orthopedics.  Non weightbearing

## 2022-04-22 NOTE — Assessment & Plan Note (Signed)
Follow up as outpatient.  

## 2022-04-22 NOTE — Assessment & Plan Note (Signed)
Patient will continue blood pressure control with amlodipine.

## 2022-04-22 NOTE — Social Work (Addendum)
CSW spoke with pt relative [name], she would like [facility] for transport. CSW shared the PT/OT recommendation, family is agreeable with pt returning to [facility].   CSW contacted [facility] and sent ALF updated fl2.

## 2022-04-22 NOTE — Assessment & Plan Note (Signed)
Hyponatremia.   Patient received IV fluids with improvement in renal function. At the time of her discharge her serum cr is 0.93 with K at 4,4 and serum bicarbonate at 19.  Na 132.

## 2022-04-22 NOTE — Assessment & Plan Note (Signed)
Her Hgb has been stable at 9.5 Plan to follow up as outpatient.

## 2022-04-22 NOTE — Discharge Instructions (Addendum)
Recommendations at discharge:    Continue pain control with acetaminophen and hydrocodone Non weight bearing to left upper extremity  Sling to left upper extremity Follow up with Dr Fara Olden in 7 to 10 days Follow up with Orthopedics as scheduled.  Patient was placed on Amlodipine for blood pressure control.  Follow up please call to schedule: [facility] [place] [phone] (F)

## 2022-04-22 NOTE — Assessment & Plan Note (Signed)
Continue pain control, sugar -tong then cast likely per orthopdeics.  Non weightbearing per orthopedics Continue PT and OT.

## 2022-04-22 NOTE — Hospital Course (Signed)
Mrs. Dampier was admitted to the hospital with the working diagnosis of left proxima humerus, proximal distal ulna and distal radius fracture.   88 yo female with the past medical history of hypertension, dyslipidemia, depression, carotid stenosis, endometrial cancer and bladder tumor who presented after a mechanical fall. Apparently she fell out of the bathroom while trying to reach her walker. Post fall left arm and shoulder pain. On her initial physical examination her blood pressure was 139/54, HR 68, RR 22 and 02 saturation 94%, lungs with no wheezing or rales, heart with S1 and S2 present and rhythmic, abdomen with no distention and no lower extremity edema.   Na 136, K 4,6 Cl 107 bicarbonate 20, glucose 122, bun 31 cr 1,31  Wbc 9,1 hgb 10.8 plt 178  Urine analysis with SG 1,016, negative protein, negative leukocytes.   Left forearm with comminuted proximal ulna fracture through both the olecranon and trochlea with mild displacement. Distal radius and ulna fractures.  Left proximal humerus fracture.  Comminuted fracture of the proximal left humerus neck with nearly 1 full shafr width anterior displacement and 17 mm of overriding  Highly comminuted distal radius and ulna fractures with impaction, between 1/2 and 1 fill shaft width dorsal displacement, DRU and radiocarpal joint involvement.   Patient was placed on analgesics and orthopedics was consulted.  Recommended cast to left forearm and pain control, non weight bearing. Non operative.  Follow up as outpatient.

## 2022-04-22 NOTE — Assessment & Plan Note (Signed)
No agitation. Continue PT and OT at assisted living facility.

## 2022-04-23 DIAGNOSIS — R451 Restlessness and agitation: Secondary | ICD-10-CM | POA: Diagnosis not present

## 2022-04-23 DIAGNOSIS — D649 Anemia, unspecified: Secondary | ICD-10-CM | POA: Diagnosis not present

## 2022-04-24 DIAGNOSIS — N179 Acute kidney failure, unspecified: Secondary | ICD-10-CM | POA: Diagnosis not present

## 2022-04-24 DIAGNOSIS — R112 Nausea with vomiting, unspecified: Secondary | ICD-10-CM | POA: Diagnosis not present

## 2022-04-24 DIAGNOSIS — N189 Chronic kidney disease, unspecified: Secondary | ICD-10-CM | POA: Diagnosis not present

## 2022-04-24 DIAGNOSIS — S42209A Unspecified fracture of upper end of unspecified humerus, initial encounter for closed fracture: Secondary | ICD-10-CM | POA: Diagnosis not present

## 2022-04-24 DIAGNOSIS — N1831 Chronic kidney disease, stage 3a: Secondary | ICD-10-CM | POA: Diagnosis not present

## 2022-04-24 DIAGNOSIS — S52202A Unspecified fracture of shaft of left ulna, initial encounter for closed fracture: Secondary | ICD-10-CM | POA: Diagnosis not present

## 2022-04-24 DIAGNOSIS — S5292XA Unspecified fracture of left forearm, initial encounter for closed fracture: Secondary | ICD-10-CM | POA: Diagnosis not present

## 2022-04-24 DIAGNOSIS — I10 Essential (primary) hypertension: Secondary | ICD-10-CM | POA: Diagnosis not present

## 2022-04-24 DIAGNOSIS — R4020 Unspecified coma: Secondary | ICD-10-CM | POA: Diagnosis not present

## 2022-04-24 DIAGNOSIS — R339 Retention of urine, unspecified: Secondary | ICD-10-CM | POA: Diagnosis not present

## 2022-04-24 DIAGNOSIS — M1990 Unspecified osteoarthritis, unspecified site: Secondary | ICD-10-CM | POA: Diagnosis not present

## 2022-04-26 DIAGNOSIS — E785 Hyperlipidemia, unspecified: Secondary | ICD-10-CM | POA: Diagnosis not present

## 2022-04-26 DIAGNOSIS — S52202D Unspecified fracture of shaft of left ulna, subsequent encounter for closed fracture with routine healing: Secondary | ICD-10-CM | POA: Diagnosis not present

## 2022-04-26 DIAGNOSIS — I10 Essential (primary) hypertension: Secondary | ICD-10-CM | POA: Diagnosis not present

## 2022-04-26 DIAGNOSIS — I6523 Occlusion and stenosis of bilateral carotid arteries: Secondary | ICD-10-CM | POA: Diagnosis not present

## 2022-04-26 DIAGNOSIS — K219 Gastro-esophageal reflux disease without esophagitis: Secondary | ICD-10-CM | POA: Diagnosis not present

## 2022-04-26 DIAGNOSIS — D649 Anemia, unspecified: Secondary | ICD-10-CM | POA: Diagnosis not present

## 2022-04-26 DIAGNOSIS — F0283 Dementia in other diseases classified elsewhere, unspecified severity, with mood disturbance: Secondary | ICD-10-CM | POA: Diagnosis not present

## 2022-04-26 DIAGNOSIS — H04129 Dry eye syndrome of unspecified lacrimal gland: Secondary | ICD-10-CM | POA: Diagnosis not present

## 2022-04-26 DIAGNOSIS — G311 Senile degeneration of brain, not elsewhere classified: Secondary | ICD-10-CM | POA: Diagnosis not present

## 2022-04-26 DIAGNOSIS — N179 Acute kidney failure, unspecified: Secondary | ICD-10-CM | POA: Diagnosis not present

## 2022-04-26 DIAGNOSIS — S5292XD Unspecified fracture of left forearm, subsequent encounter for closed fracture with routine healing: Secondary | ICD-10-CM | POA: Diagnosis not present

## 2022-04-27 DIAGNOSIS — D649 Anemia, unspecified: Secondary | ICD-10-CM | POA: Diagnosis not present

## 2022-04-27 DIAGNOSIS — I10 Essential (primary) hypertension: Secondary | ICD-10-CM | POA: Diagnosis not present

## 2022-04-27 DIAGNOSIS — E559 Vitamin D deficiency, unspecified: Secondary | ICD-10-CM | POA: Diagnosis not present

## 2022-04-27 DIAGNOSIS — R7889 Finding of other specified substances, not normally found in blood: Secondary | ICD-10-CM | POA: Diagnosis not present

## 2022-04-29 DIAGNOSIS — E785 Hyperlipidemia, unspecified: Secondary | ICD-10-CM | POA: Diagnosis not present

## 2022-04-29 DIAGNOSIS — S5292XD Unspecified fracture of left forearm, subsequent encounter for closed fracture with routine healing: Secondary | ICD-10-CM | POA: Diagnosis not present

## 2022-04-29 DIAGNOSIS — G311 Senile degeneration of brain, not elsewhere classified: Secondary | ICD-10-CM | POA: Diagnosis not present

## 2022-04-29 DIAGNOSIS — F0283 Dementia in other diseases classified elsewhere, unspecified severity, with mood disturbance: Secondary | ICD-10-CM | POA: Diagnosis not present

## 2022-04-29 DIAGNOSIS — I10 Essential (primary) hypertension: Secondary | ICD-10-CM | POA: Diagnosis not present

## 2022-04-29 DIAGNOSIS — S52202D Unspecified fracture of shaft of left ulna, subsequent encounter for closed fracture with routine healing: Secondary | ICD-10-CM | POA: Diagnosis not present

## 2022-05-01 DIAGNOSIS — S5292XD Unspecified fracture of left forearm, subsequent encounter for closed fracture with routine healing: Secondary | ICD-10-CM | POA: Diagnosis not present

## 2022-05-01 DIAGNOSIS — I10 Essential (primary) hypertension: Secondary | ICD-10-CM | POA: Diagnosis not present

## 2022-05-01 DIAGNOSIS — S52202D Unspecified fracture of shaft of left ulna, subsequent encounter for closed fracture with routine healing: Secondary | ICD-10-CM | POA: Diagnosis not present

## 2022-05-01 DIAGNOSIS — G311 Senile degeneration of brain, not elsewhere classified: Secondary | ICD-10-CM | POA: Diagnosis not present

## 2022-05-01 DIAGNOSIS — M79601 Pain in right arm: Secondary | ICD-10-CM | POA: Diagnosis not present

## 2022-05-01 DIAGNOSIS — E785 Hyperlipidemia, unspecified: Secondary | ICD-10-CM | POA: Diagnosis not present

## 2022-05-01 DIAGNOSIS — F0283 Dementia in other diseases classified elsewhere, unspecified severity, with mood disturbance: Secondary | ICD-10-CM | POA: Diagnosis not present

## 2022-05-01 DIAGNOSIS — M25511 Pain in right shoulder: Secondary | ICD-10-CM | POA: Diagnosis not present

## 2022-05-03 DIAGNOSIS — I10 Essential (primary) hypertension: Secondary | ICD-10-CM | POA: Diagnosis not present

## 2022-05-03 DIAGNOSIS — G311 Senile degeneration of brain, not elsewhere classified: Secondary | ICD-10-CM | POA: Diagnosis not present

## 2022-05-03 DIAGNOSIS — S5292XD Unspecified fracture of left forearm, subsequent encounter for closed fracture with routine healing: Secondary | ICD-10-CM | POA: Diagnosis not present

## 2022-05-03 DIAGNOSIS — E785 Hyperlipidemia, unspecified: Secondary | ICD-10-CM | POA: Diagnosis not present

## 2022-05-03 DIAGNOSIS — F0283 Dementia in other diseases classified elsewhere, unspecified severity, with mood disturbance: Secondary | ICD-10-CM | POA: Diagnosis not present

## 2022-05-03 DIAGNOSIS — S52202D Unspecified fracture of shaft of left ulna, subsequent encounter for closed fracture with routine healing: Secondary | ICD-10-CM | POA: Diagnosis not present

## 2022-05-04 DIAGNOSIS — G311 Senile degeneration of brain, not elsewhere classified: Secondary | ICD-10-CM | POA: Diagnosis not present

## 2022-05-04 DIAGNOSIS — S52202D Unspecified fracture of shaft of left ulna, subsequent encounter for closed fracture with routine healing: Secondary | ICD-10-CM | POA: Diagnosis not present

## 2022-05-04 DIAGNOSIS — I10 Essential (primary) hypertension: Secondary | ICD-10-CM | POA: Diagnosis not present

## 2022-05-04 DIAGNOSIS — S5292XD Unspecified fracture of left forearm, subsequent encounter for closed fracture with routine healing: Secondary | ICD-10-CM | POA: Diagnosis not present

## 2022-05-04 DIAGNOSIS — E785 Hyperlipidemia, unspecified: Secondary | ICD-10-CM | POA: Diagnosis not present

## 2022-05-04 DIAGNOSIS — F0283 Dementia in other diseases classified elsewhere, unspecified severity, with mood disturbance: Secondary | ICD-10-CM | POA: Diagnosis not present

## 2022-05-05 DIAGNOSIS — S5292XD Unspecified fracture of left forearm, subsequent encounter for closed fracture with routine healing: Secondary | ICD-10-CM | POA: Diagnosis not present

## 2022-05-05 DIAGNOSIS — F0283 Dementia in other diseases classified elsewhere, unspecified severity, with mood disturbance: Secondary | ICD-10-CM | POA: Diagnosis not present

## 2022-05-05 DIAGNOSIS — G311 Senile degeneration of brain, not elsewhere classified: Secondary | ICD-10-CM | POA: Diagnosis not present

## 2022-05-05 DIAGNOSIS — S52202D Unspecified fracture of shaft of left ulna, subsequent encounter for closed fracture with routine healing: Secondary | ICD-10-CM | POA: Diagnosis not present

## 2022-05-05 DIAGNOSIS — I10 Essential (primary) hypertension: Secondary | ICD-10-CM | POA: Diagnosis not present

## 2022-05-05 DIAGNOSIS — E785 Hyperlipidemia, unspecified: Secondary | ICD-10-CM | POA: Diagnosis not present

## 2022-05-06 DIAGNOSIS — E785 Hyperlipidemia, unspecified: Secondary | ICD-10-CM | POA: Diagnosis not present

## 2022-05-06 DIAGNOSIS — G311 Senile degeneration of brain, not elsewhere classified: Secondary | ICD-10-CM | POA: Diagnosis not present

## 2022-05-06 DIAGNOSIS — S5292XD Unspecified fracture of left forearm, subsequent encounter for closed fracture with routine healing: Secondary | ICD-10-CM | POA: Diagnosis not present

## 2022-05-06 DIAGNOSIS — S52202D Unspecified fracture of shaft of left ulna, subsequent encounter for closed fracture with routine healing: Secondary | ICD-10-CM | POA: Diagnosis not present

## 2022-05-06 DIAGNOSIS — F0283 Dementia in other diseases classified elsewhere, unspecified severity, with mood disturbance: Secondary | ICD-10-CM | POA: Diagnosis not present

## 2022-05-06 DIAGNOSIS — I10 Essential (primary) hypertension: Secondary | ICD-10-CM | POA: Diagnosis not present

## 2022-05-07 DIAGNOSIS — E785 Hyperlipidemia, unspecified: Secondary | ICD-10-CM | POA: Diagnosis not present

## 2022-05-07 DIAGNOSIS — I10 Essential (primary) hypertension: Secondary | ICD-10-CM | POA: Diagnosis not present

## 2022-05-07 DIAGNOSIS — S5292XD Unspecified fracture of left forearm, subsequent encounter for closed fracture with routine healing: Secondary | ICD-10-CM | POA: Diagnosis not present

## 2022-05-07 DIAGNOSIS — S52202D Unspecified fracture of shaft of left ulna, subsequent encounter for closed fracture with routine healing: Secondary | ICD-10-CM | POA: Diagnosis not present

## 2022-05-07 DIAGNOSIS — G311 Senile degeneration of brain, not elsewhere classified: Secondary | ICD-10-CM | POA: Diagnosis not present

## 2022-05-07 DIAGNOSIS — F0283 Dementia in other diseases classified elsewhere, unspecified severity, with mood disturbance: Secondary | ICD-10-CM | POA: Diagnosis not present

## 2022-05-10 DIAGNOSIS — 419620001 Death: Secondary | SNOMED CT | POA: Diagnosis not present

## 2022-05-10 DEATH — deceased

## 2022-05-22 ENCOUNTER — Other Ambulatory Visit (HOSPITAL_COMMUNITY): Payer: Self-pay
